# Patient Record
Sex: Female | Born: 2000 | Race: Black or African American | Hispanic: No | Marital: Single
Health system: Southern US, Community
[De-identification: ages and names within clinical notes are randomized; demographics above are authoritative.]

## PROBLEM LIST (undated history)

## (undated) DIAGNOSIS — F32A Depression, unspecified: Secondary | ICD-10-CM

## (undated) DIAGNOSIS — F419 Anxiety disorder, unspecified: Secondary | ICD-10-CM

## (undated) DIAGNOSIS — F329 Major depressive disorder, single episode, unspecified: Secondary | ICD-10-CM

## (undated) HISTORY — PX: NO PAST SURGERIES: SHX2092

---
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
## 2021-10-25 IMAGING — US US MFM OB COMP +14 WKS
1 series · 13 of 28 positions shown · non-contrast
Comparison: none

[Series 1: us mfm ob comp +14 wks · 13 of 127 slices shown]
[im 5/127]
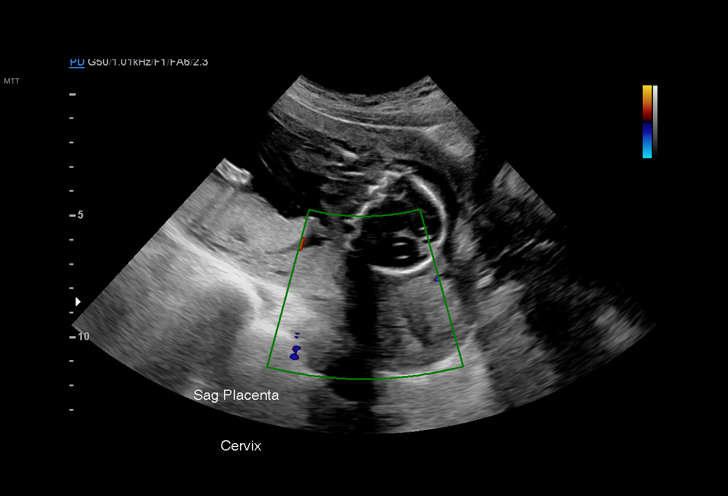
[im 15/127]
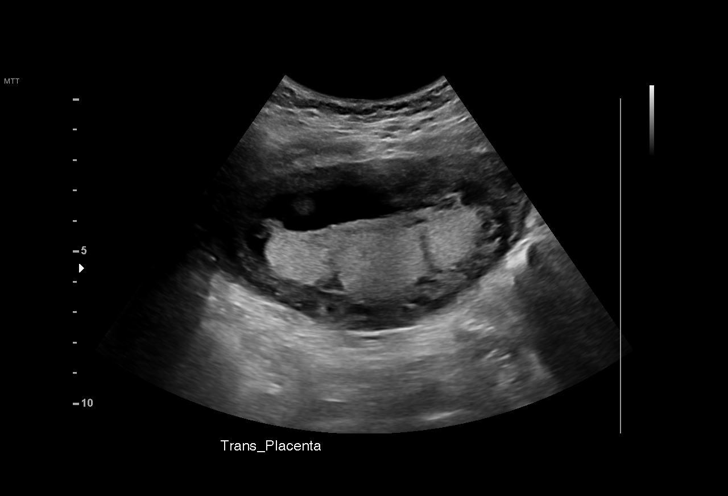
[im 24/127]
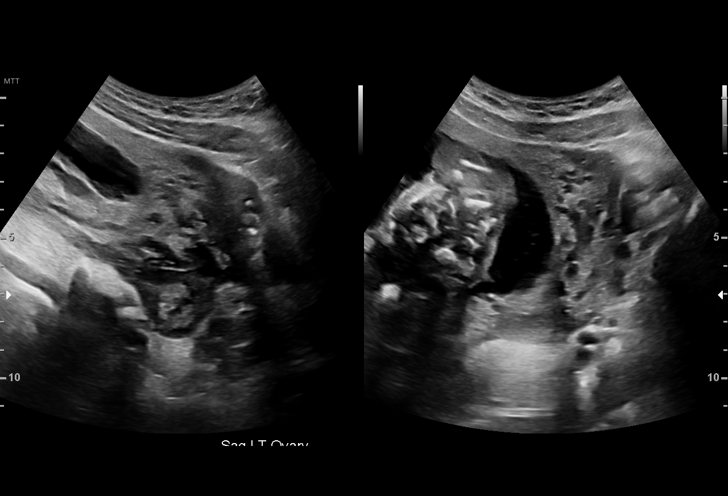
[im 33/127]
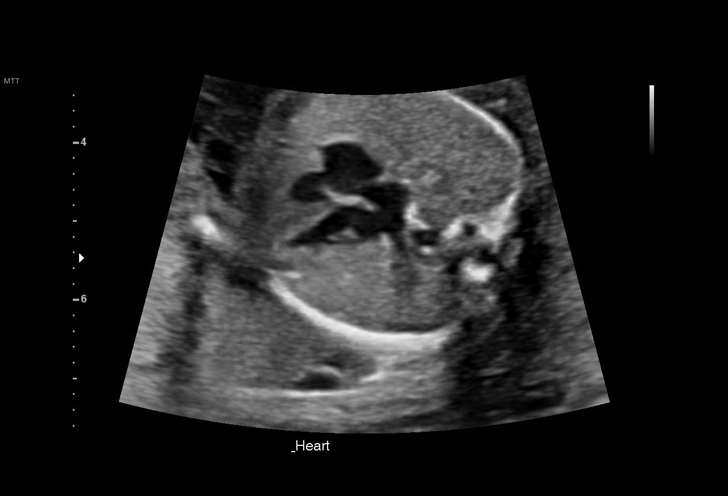
[im 43/127]
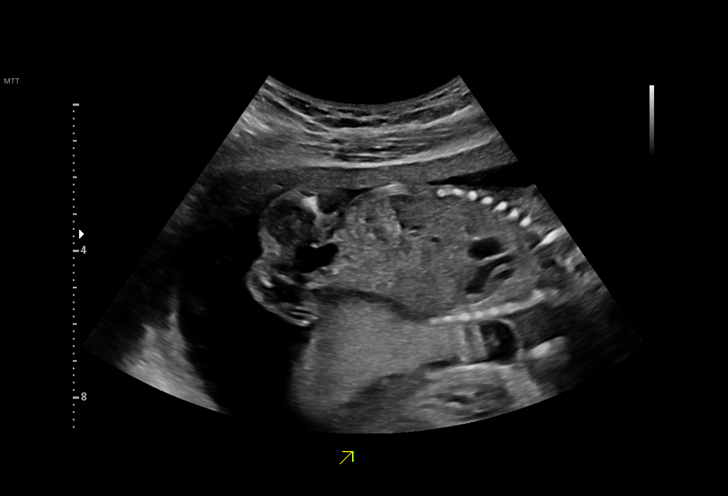
[im 52/127]
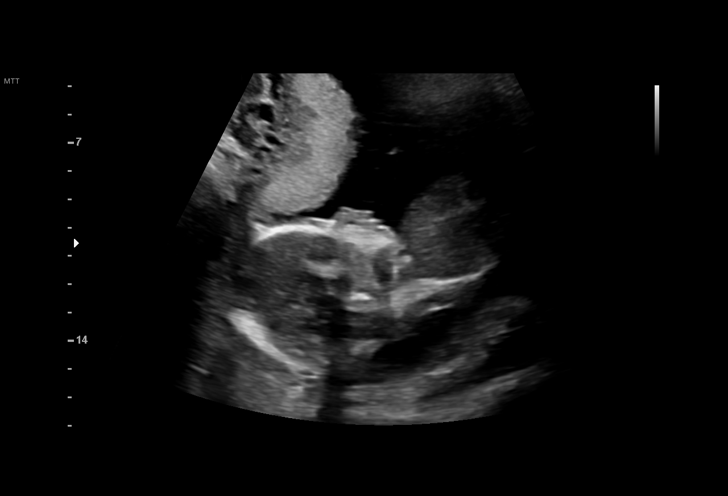
[im 66/127]
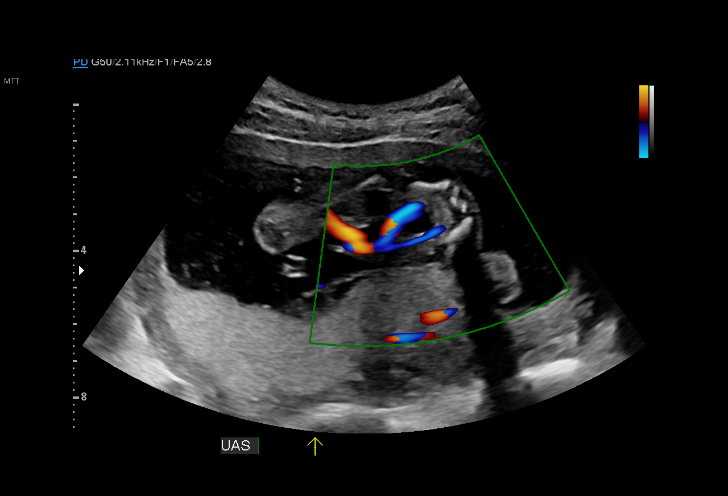
[im 75/127]
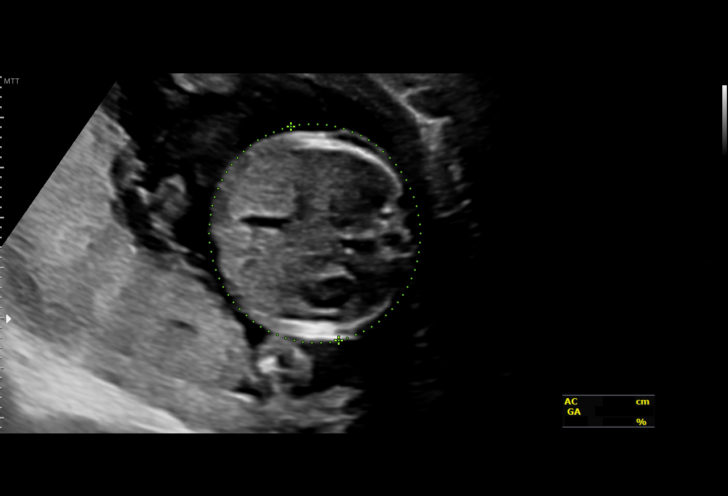
[im 85/127]
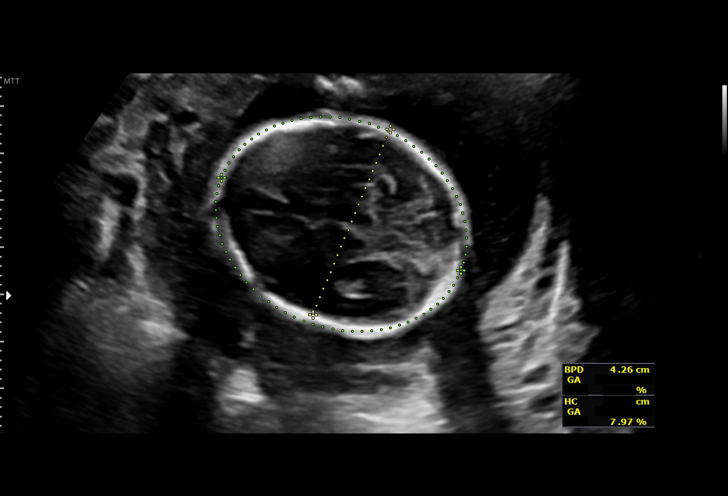
[im 94/127]
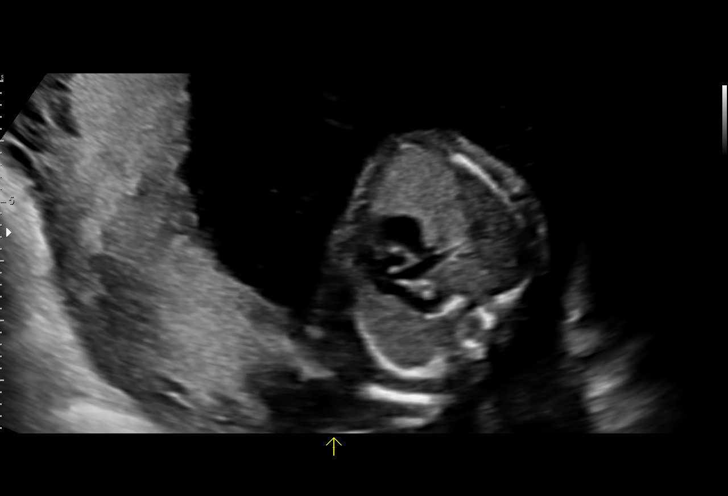
[im 103/127]
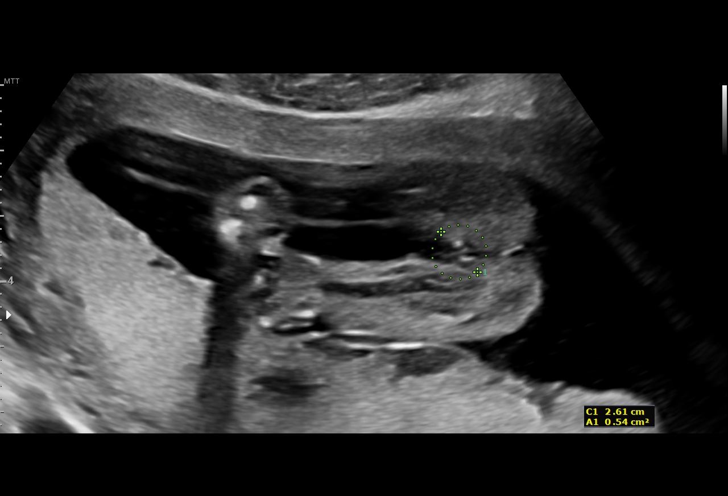
[im 113/127]
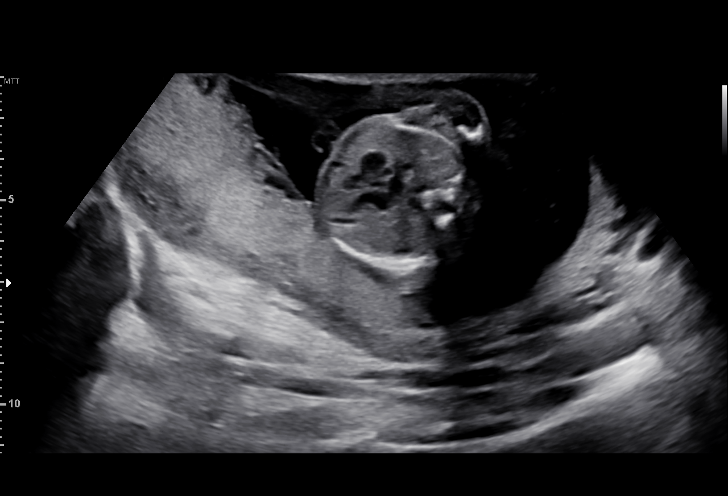
[im 122/127]
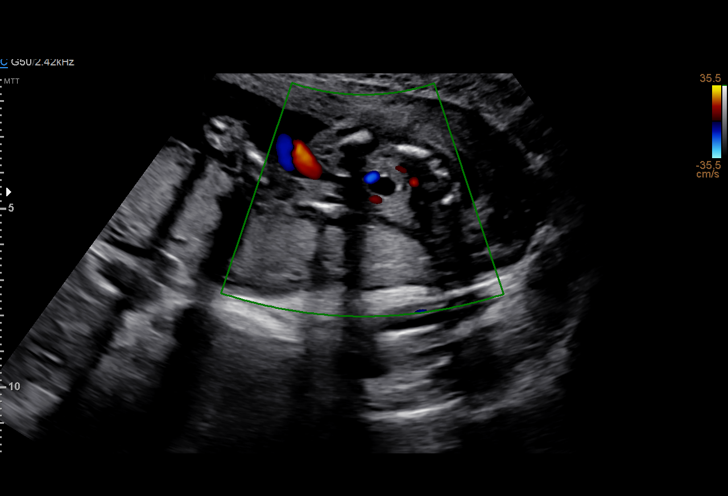

[13 of 28 positions shown; findings below may reference images not displayed]

1  US MFM OB COMP + 14 WK                76805.01    ILANA DUFF

Indications

 Encounter for antenatal screening for
 malformations
 19 weeks gestation of pregnancy
 NIPS:Low risk, Fem, FF:12%,   Genetic
 screening: Negative
Fetal Evaluation

 Num Of Fetuses:         1
 Fetal Heart Rate(bpm):  153
 Cardiac Activity:       Observed
 Presentation:           Cephalic
 Placenta:               Posterior
 P. Cord Insertion:      Visualized, central

 Amniotic Fluid
 AFI FV:      Within normal limits

                             Largest Pocket(cm)
                             4
Biometry
 BPD:      42.6  mm     G. Age:  18w 6d         33  %    CI:        75.56   %    70 - 86
                                                         FL/HC:      18.6   %    16.1 -
 HC:      155.4  mm     G. Age:  18w 3d         11  %    HC/AC:      1.14        1.09 -
 AC:      136.2  mm     G. Age:  19w 0d         38  %    FL/BPD:     67.8   %
 FL:       28.9  mm     G. Age:  18w 6d         28  %    FL/AC:      21.2   %    20 - 24
 HUM:      28.6  mm     G. Age:  19w 2d         50  %
 CER:      18.9  mm     G. Age:  18w 4d         17  %
 NFT:       4.2  mm

 LV:        5.9  mm
 CM:        4.4  mm

 Est. FW:     265  gm      0 lb 9 oz     26  %
OB History

 Gravidity:    1
Gestational Age

 LMP:           19w 2d        Date:  12/12/19                 EDD:   09/17/20
 U/S Today:     18w 6d                                        EDD:   09/20/20
 Best:          19w 2d     Det. By:  LMP  (12/12/19)          EDD:   09/17/20
Anatomy

 Cranium:               Appears normal         Aortic Arch:            Appears normal
 Cavum:                 Appears normal         Ductal Arch:            Not well visualized
 Ventricles:            Appears normal         Diaphragm:              Appears normal
 Choroid Plexus:        Appears normal         Stomach:                Appears normal, left
                                                                       sided
 Cerebellum:            Appears normal         Abdomen:                Appears normal
 Posterior Fossa:       Appears normal         Abdominal Wall:         Appears nml (cord
                                                                       insert, abd wall)
 Nuchal Fold:           Appears normal         Cord Vessels:           Appears normal (3
                                                                       vessel cord)
 Face:                  Appears normal         Kidneys:                Appear normal
                        (orbits and profile)
 Lips:                  Appears normal         Bladder:                Appears normal
 Thoracic:              Appears normal         Spine:                  Appears normal
 Heart:                 Not well visualized    Upper Extremities:      Appears normal
 RVOT:                  Not well visualized    Lower Extremities:      Appears normal
 LVOT:                  Not well visualized

 Other:  Fetus appears to be female. Nasal bone visualized. Heels and 5th
         digit visualized. Technically difficult due to fetal position.
Cervix Uterus Adnexa

 Cervix
 Length:            3.2  cm.
 Closed

 Uterus
 No abnormality visualized.
 Right Ovary
 Within normal limits. No adnexal mass visualized.

 Left Ovary
 Within normal limits. No adnexal mass visualized.

 Cul De Sac
 No free fluid seen.

 Adnexa
 No abnormality visualized.
Comments

 This patient was seen for a detailed fetal anatomy scan.
 She denies any significant past medical history and denies
 any problems in her current pregnancy.
 She had a cell free DNA test earlier in her pregnancy which
 indicated a low risk for trisomy 21, 18, and 13. A female fetus
 is predicted.
 She was informed that the fetal growth and amniotic fluid
 level were appropriate for her gestational age.
 There were no obvious fetal anomalies noted on today's
 ultrasound exam.  However, the views of the fetal anatomy
 were limited today due to the fetal position.
 The patient was informed that anomalies may be missed due
 to technical limitations. If the fetus is in a suboptimal position
 or maternal habitus is increased, visualization of the fetus in
 the maternal uterus may be impaired.
 A follow-up exam was scheduled in 4 weeks to complete the
 views of the fetal anatomy.

## 2021-12-04 ENCOUNTER — Ambulatory Visit (INDEPENDENT_AMBULATORY_CARE_PROVIDER_SITE_OTHER): Payer: Medicaid Other | Admitting: *Deleted

## 2021-12-04 ENCOUNTER — Other Ambulatory Visit: Payer: Self-pay

## 2021-12-04 DIAGNOSIS — N912 Amenorrhea, unspecified: Secondary | ICD-10-CM

## 2021-12-04 MED ORDER — POLYETHYLENE GLYCOL 3350 17 G PO PACK: 17.0000 g | PACK | Freq: Every day | ORAL | 0 refills | Status: DC

## 2021-12-04 NOTE — Progress Notes (Signed)
Ms. Kiernan presents today for UPT. She complains of constipation. ? ?LMP:10/20/21 ?EDD:07/27/22 ?   ?OBJECTIVE: Appears well, in no apparent distress.  ?OB History   ? ? Gravida  ?1  ? Para  ?1  ? Term  ?1  ? Preterm  ?   ? AB  ?   ? Living  ?1  ?  ? ? SAB  ?   ? IAB  ?   ? Ectopic  ?   ? Multiple  ?0  ? Live Births  ?1  ?   ?  ?  ? ?Home UPT Result:positive ?In-Office UPT result:positive ? ?I have reviewed the patient's allergies and medications.  ? ?ASSESSMENT: Positive pregnancy test ? ?PLAN ?Prenatal care to be completed at: [facility] ?Miralax ordered ? ? ?

## 2021-12-18 ENCOUNTER — Ambulatory Visit (INDEPENDENT_AMBULATORY_CARE_PROVIDER_SITE_OTHER): Payer: Medicaid Other | Admitting: Obstetrics and Gynecology

## 2021-12-18 ENCOUNTER — Ambulatory Visit (INDEPENDENT_AMBULATORY_CARE_PROVIDER_SITE_OTHER): Payer: Medicaid Other

## 2021-12-18 ENCOUNTER — Other Ambulatory Visit: Payer: Self-pay

## 2021-12-18 VITALS — BP 121/73 | HR 91 | Ht 64.0 in | Wt 149.9 lb

## 2021-12-18 DIAGNOSIS — N63 Unspecified lump in unspecified breast: Secondary | ICD-10-CM | POA: Diagnosis not present

## 2021-12-18 DIAGNOSIS — Z3481 Encounter for supervision of other normal pregnancy, first trimester: Secondary | ICD-10-CM

## 2021-12-18 DIAGNOSIS — O3680X0 Pregnancy with inconclusive fetal viability, not applicable or unspecified: Secondary | ICD-10-CM

## 2021-12-18 DIAGNOSIS — N6311 Unspecified lump in the right breast, upper outer quadrant: Secondary | ICD-10-CM | POA: Diagnosis not present

## 2021-12-18 DIAGNOSIS — Z3490 Encounter for supervision of normal pregnancy, unspecified, unspecified trimester: Secondary | ICD-10-CM | POA: Insufficient documentation

## 2021-12-18 HISTORY — DX: Unspecified lump in unspecified breast: N63.0

## 2021-12-18 MED ORDER — BLOOD PRESSURE KIT DEVI: 1.0000 | 0 refills | Status: DC

## 2021-12-18 MED ORDER — GOJJI WEIGHT SCALE MISC: 1.0000 | 0 refills | Status: AC

## 2021-12-18 NOTE — Progress Notes (Signed)
?  CC; breast mass/tenderness ?Subjective:  ? ? Patient ID: Cynthia Villarreal, female    DOB: Feb 21, 2001, 21 y.o.   MRN: [id number] ? ?HPI ?21 yo G2P1 seen for right breast tenderness.  PT has noted the breast tenderness for the last 1-2 weeks.  Pt states she can feel a mobile nodule only in the right breast.  She denies any breast leakage.  Pt also recently found out she was pregnant. ? ? ?Review of Systems ? ?   ?Objective:  ? Physical Exam ?There were no vitals filed for this visit. ? ?Breast exam:  palpable pea sized mass in the right breast at 10 oclock ? ?   ?Assessment & Plan:  ? ?1. Breast mass in female ?Likely benign mass tender and more noticeable due to pregnancy hormones ?Will get breast ultrasound for further eval, pt hasnew OB visit in 2-3 weeks ? ?- US BREAST LTD UNI RIGHT INC AXILLA; Future ? ? ? ?Cynthia Basil, MD ?Faculty Attending, [facility]  ?

## 2021-12-18 NOTE — Progress Notes (Signed)
Pt complaining of tenderness and lump in R breast ?

## 2021-12-18 NOTE — Progress Notes (Signed)
Patient was assessed and managed by nursing staff during this encounter. I have reviewed the chart and agree with the documentation and plan. I have also made any necessary editorial changes. ? ?Jalonda Antigua A Jacquez Sheetz, MD ?12/18/2021 1:20 PM   ?

## 2021-12-18 NOTE — Progress Notes (Cosign Needed)
New OB Intake ? ?I connected with  Cynthia Villarreal on 12/18/21 at 10:15 AM EDT by in person Video Visit and verified that I am speaking with the correct person using two identifiers. Nurse is located at [facility] and pt is located at [place]. ? ?I discussed the limitations, risks, security and privacy concerns of performing an evaluation and management service by telephone and the availability of in person appointments. I also discussed with the patient that there may be a patient responsible charge related to this service. The patient expressed understanding and agreed to proceed. ? ?I explained I am completing New OB Intake today. We discussed her EDD of 07/27/22 that is based on LMP of 10/20/21. Pt is G2/P1001. I reviewed her allergies, medications, Medical/Surgical/OB history, and appropriate screenings. I informed her of [facility] services. Based on history, this is a/an  pregnancy uncomplicated .  ? ?Patient Active Problem List  ? Diagnosis Date Noted  ? Encounter for induction of labor 09/01/2020  ? Depression   ? GBS (group B Streptococcus carrier), +RV culture, currently pregnant 08/27/2020  ? IUGR (intrauterine growth restriction) affecting care of mother 07/03/2020  ? Poor weight gain of pregnancy 05/07/2020  ? Chlamydia trachomatis infection in mother during pregnancy 04/04/2020  ? Supervision of high risk pregnancy, antepartum 02/29/2020  ? ? ?Concerns addressed today ? ?Delivery Plans:  ?Plans to deliver at [facility].  ? ?MyChart/Babyscripts ?MyChart access verified. I explained pt will have some visits in office and some virtually. Babyscripts instructions given and order placed. Patient verifies receipt of registration text/e-mail. Account successfully created and app downloaded. ? ?Blood Pressure Cuff  ?Blood pressure cuff ordered for patient to pick-up from [employer]. Explained after first prenatal appt pt will check weekly and document in 68. ? ?Weight scale: Patient does not  have weight  scale. Weight scale ordered for patient to pick up from [employer].  ? ?Anatomy US ?Explained first scheduled [place] will be around 19 weeks. Dating and viability scan performed today. ? ?Labs ?Discussed Johnsie Cancel genetic screening with patient. Would like both Panorama and Horizon drawn at new OB visit.Also if interested in genetic testing, tell patient she will need AFP 15-21 weeks to complete genetic testing .Routine prenatal labs needed. ? ?Covid Vaccine ?Patient has not covid vaccine.  ? ?  ?Is patient interested in [place]? No  "Interested in [employer] - Schedule next visit with CNM" on sticky note ? ?Informed patient of [facility] website  and placed link in her AVS.  ? ?Social Determinants of Health ?Food Insecurity: Patient denies food insecurity. ?WIC Referral: Patient is interested in referral to [facility].  ?Transportation: Patient denies transportation needs. ?Childcare: Discussed no children allowed at ultrasound appointments. Offered childcare services; patient declines childcare services at this time. ? ?Send link to Pregnancy Navigators ? ? ?Placed OB Box on problem list and updated ? ?First visit review ?I reviewed new OB appt with pt. I explained she will have a pelvic exam, ob bloodwork with genetic screening, and PAP smear. Explained pt will be seen by Arlina Robes at first visit; encounter routed to appropriate provider. Explained that patient will be seen by pregnancy navigator following visit with provider. [facility] information placed in AVS.  ? ?Lucianne Lei, RN ?12/18/2021  10:49 AM  ?

## 2021-12-23 ENCOUNTER — Inpatient Hospital Stay (HOSPITAL_COMMUNITY)
Admission: EM | Admit: 2021-12-23 | Discharge: 2021-12-25 | DRG: 832 | Disposition: A | Discharge status: Home / Self Care | Payer: Medicaid Other | Attending: Internal Medicine | Admitting: Internal Medicine

## 2021-12-23 ENCOUNTER — Emergency Department (HOSPITAL_COMMUNITY): Payer: Medicaid Other

## 2021-12-23 ENCOUNTER — Encounter (HOSPITAL_COMMUNITY): Payer: Self-pay

## 2021-12-23 ENCOUNTER — Other Ambulatory Visit: Payer: Self-pay

## 2021-12-23 DIAGNOSIS — E876 Hypokalemia: Secondary | ICD-10-CM | POA: Diagnosis present

## 2021-12-23 DIAGNOSIS — R569 Unspecified convulsions: Secondary | ICD-10-CM

## 2021-12-23 DIAGNOSIS — G4089 Other seizures: Secondary | ICD-10-CM | POA: Diagnosis not present

## 2021-12-23 DIAGNOSIS — O99341 Other mental disorders complicating pregnancy, first trimester: Secondary | ICD-10-CM | POA: Diagnosis present

## 2021-12-23 DIAGNOSIS — N12 Tubulo-interstitial nephritis, not specified as acute or chronic: Principal | ICD-10-CM

## 2021-12-23 DIAGNOSIS — Z20822 Contact with and (suspected) exposure to covid-19: Secondary | ICD-10-CM | POA: Diagnosis present

## 2021-12-23 DIAGNOSIS — R32 Unspecified urinary incontinence: Secondary | ICD-10-CM | POA: Diagnosis present

## 2021-12-23 DIAGNOSIS — F410 Panic disorder [episodic paroxysmal anxiety] without agoraphobia: Secondary | ICD-10-CM | POA: Diagnosis present

## 2021-12-23 DIAGNOSIS — O2301 Infections of kidney in pregnancy, first trimester: Secondary | ICD-10-CM | POA: Diagnosis present

## 2021-12-23 DIAGNOSIS — K029 Dental caries, unspecified: Secondary | ICD-10-CM | POA: Diagnosis present

## 2021-12-23 DIAGNOSIS — E871 Hypo-osmolality and hyponatremia: Secondary | ICD-10-CM | POA: Diagnosis present

## 2021-12-23 DIAGNOSIS — O99351 Diseases of the nervous system complicating pregnancy, first trimester: Principal | ICD-10-CM | POA: Diagnosis present

## 2021-12-23 DIAGNOSIS — F419 Anxiety disorder, unspecified: Secondary | ICD-10-CM | POA: Diagnosis present

## 2021-12-23 DIAGNOSIS — Z79899 Other long term (current) drug therapy: Secondary | ICD-10-CM

## 2021-12-23 DIAGNOSIS — Z8249 Family history of ischemic heart disease and other diseases of the circulatory system: Secondary | ICD-10-CM

## 2021-12-23 DIAGNOSIS — N390 Urinary tract infection, site not specified: Secondary | ICD-10-CM | POA: Diagnosis present

## 2021-12-23 DIAGNOSIS — O99281 Endocrine, nutritional and metabolic diseases complicating pregnancy, first trimester: Secondary | ICD-10-CM | POA: Diagnosis present

## 2021-12-23 DIAGNOSIS — Z833 Family history of diabetes mellitus: Secondary | ICD-10-CM

## 2021-12-23 DIAGNOSIS — Z3A11 11 weeks gestation of pregnancy: Secondary | ICD-10-CM

## 2021-12-23 DIAGNOSIS — Z3490 Encounter for supervision of normal pregnancy, unspecified, unspecified trimester: Secondary | ICD-10-CM

## 2021-12-23 LAB — COMPREHENSIVE METABOLIC PANEL
ALT: 8 U/L (ref 0–44)
AST: 16 U/L (ref 15–41)
Albumin: 4 g/dL (ref 3.5–5.0)
Alkaline Phosphatase: 71 U/L (ref 38–126)
Anion gap: 10 (ref 5–15)
BUN: <5 mg/dL — ABNORMAL LOW (ref 6–20)
CO2: 23 mmol/L (ref 22–32)
Calcium: 9.6 mg/dL (ref 8.9–10.3)
Chloride: 101 mmol/L (ref 98–111)
Creatinine, Ser: 0.67 mg/dL (ref 0.44–1.00)
GFR, Estimated: >60 mL/min (ref >60)
Glucose, Bld: 87 mg/dL (ref 70–99)
Potassium: 3.1 mmol/L — ABNORMAL LOW (ref 3.5–5.1)
Sodium: 134 mmol/L — ABNORMAL LOW (ref 135–145)
Total Bilirubin: 0.9 mg/dL (ref 0.3–1.2)
Total Protein: 7.9 g/dL (ref 6.5–8.1)

## 2021-12-23 LAB — CBC WITH DIFFERENTIAL/PLATELET
Abs Immature Granulocytes: 0.03 10*3/uL (ref 0.00–0.07)
Basophils Absolute: 0 10*3/uL (ref 0.0–0.1)
Basophils Relative: 0 %
Eosinophils Absolute: 0 10*3/uL (ref 0.0–0.5)
Eosinophils Relative: 0 %
HCT: 39.3 % (ref 36.0–46.0)
Hemoglobin: 12.7 g/dL (ref 12.0–15.0)
Immature Granulocytes: 0 %
Lymphocytes Relative: 9 %
Lymphs Abs: 0.8 10*3/uL (ref 0.7–4.0)
MCH: 28.1 pg (ref 26.0–34.0)
MCHC: 32.3 g/dL (ref 30.0–36.0)
MCV: 86.9 fL (ref 80.0–100.0)
Monocytes Absolute: 0.5 10*3/uL (ref 0.1–1.0)
Monocytes Relative: 5 %
Neutro Abs: 8.2 10*3/uL — ABNORMAL HIGH (ref 1.7–7.7)
Neutrophils Relative %: 86 %
Platelets: 225 10*3/uL (ref 150–400)
RBC: 4.52 MIL/uL (ref 3.87–5.11)
RDW: 13.7 % (ref 11.5–15.5)
WBC: 9.5 10*3/uL (ref 4.0–10.5)
nRBC: 0 % (ref 0.0–0.2)

## 2021-12-23 LAB — URINALYSIS, ROUTINE W REFLEX MICROSCOPIC
Bilirubin Urine: NEGATIVE
Glucose, UA: NEGATIVE mg/dL
Ketones, ur: 5 mg/dL — AB
Nitrite: POSITIVE — AB
Protein, ur: NEGATIVE mg/dL
Specific Gravity, Urine: 1.009 (ref 1.005–1.030)
WBC, UA: >50 WBC/hpf — ABNORMAL HIGH (ref 0–5)
pH: 7 (ref 5.0–8.0)

## 2021-12-23 LAB — CBG MONITORING, ED: Glucose-Capillary: 81 mg/dL (ref 70–99)

## 2021-12-23 LAB — RAPID URINE DRUG SCREEN, HOSP PERFORMED
Amphetamines: NOT DETECTED
Barbiturates: NOT DETECTED
Benzodiazepines: NOT DETECTED
Cocaine: NOT DETECTED
Opiates: NOT DETECTED
Tetrahydrocannabinol: POSITIVE — AB

## 2021-12-23 LAB — WET PREP, GENITAL
Clue Cells Wet Prep HPF POC: NONE SEEN
Sperm: NONE SEEN
Trich, Wet Prep: NONE SEEN
WBC, Wet Prep HPF POC: >=10 — AB (ref <10)
Yeast Wet Prep HPF POC: NONE SEEN

## 2021-12-23 LAB — HCG, QUANTITATIVE, PREGNANCY: hCG, Beta Chain, Quant, S: 188068 m[IU]/mL — ABNORMAL HIGH (ref <5)

## 2021-12-23 LAB — TROPONIN I (HIGH SENSITIVITY): Troponin I (High Sensitivity): 13 ng/L (ref <18)

## 2021-12-23 LAB — MAGNESIUM: Magnesium: 1.8 mg/dL (ref 1.7–2.4)

## 2021-12-23 LAB — POC URINE PREG, ED: Preg Test, Ur: POSITIVE — AB

## 2021-12-23 MED ORDER — ONDANSETRON HCL 4 MG/2ML IJ SOLN
4.0000 mg | Freq: Once | INTRAMUSCULAR | Status: AC
  Administered 2021-12-23: 4 mg via INTRAVENOUS

## 2021-12-23 MED ORDER — SODIUM CHLORIDE 0.9 % IV SOLN
1.0000 g | Freq: Once | INTRAVENOUS | Status: AC
  Administered 2021-12-23: 1 g via INTRAVENOUS
  Filled 2021-12-23: qty 10

## 2021-12-23 MED ORDER — LIDOCAINE-EPINEPHRINE (PF) 2 %-1:200000 IJ SOLN
10.0000 mL | Freq: Once | INTRAMUSCULAR | Status: DC
Start: 2021-12-23 — End: 2021-12-23

## 2021-12-23 MED ORDER — LACTATED RINGERS IV BOLUS
1000.0000 mL | Freq: Once | INTRAVENOUS | Status: AC
  Administered 2021-12-23: 1000 mL via INTRAVENOUS

## 2021-12-23 MED ORDER — POTASSIUM CHLORIDE CRYS ER 20 MEQ PO TBCR
40.0000 meq | EXTENDED_RELEASE_TABLET | Freq: Once | ORAL | Status: AC
Start: 2021-12-23 — End: 2021-12-23
  Administered 2021-12-23: 40 meq via ORAL
  Filled 2021-12-23: qty 2

## 2021-12-23 MED ORDER — ACETAMINOPHEN 325 MG PO TABS
650.0000 mg | ORAL_TABLET | Freq: Once | ORAL | Status: AC
  Administered 2021-12-23: 650 mg via ORAL
  Filled 2021-12-23: qty 2

## 2021-12-23 MED ORDER — AMOXICILLIN-POT CLAVULANATE 875-125 MG PO TABS
1.0000 | ORAL_TABLET | Freq: Once | ORAL | Status: AC
  Administered 2021-12-23: 1 via ORAL
  Filled 2021-12-23: qty 1

## 2021-12-23 MED ORDER — ONDANSETRON HCL 4 MG/2ML IJ SOLN
INTRAMUSCULAR | Status: AC
  Filled 2021-12-23: qty 2

## 2021-12-23 NOTE — ED Triage Notes (Signed)
Pt also c/o cloudy and malodorous urine.  ?

## 2021-12-23 NOTE — H&P (Signed)
?History and Physical  ? ? ?Cynthia Villarreal MEQ:[id number] DOB: 01/19/2001 DOA: 12/23/2021 ? ?PCP: Department, [facility]  ?Patient coming from: Home ? ?I have personally briefly reviewed patient's old medical records in [place] ? ?Chief Complaint: Seizure-like activity ? ?HPI: ?Cynthia Villarreal is a G2P1 21 y.o. female with medical history significant for depression/anxiety who is [redacted] weeks pregnant presented to the ED for evaluation of seizure-like activity.  History is limited from patient as she does not recall the events and is otherwise supplemented by EDP and chart review. ? ?Patient states that she was seen by her dentist earlier 3/20.  She was told she had an infection of her left lower posterior tooth and was prescribed Augmentin.  She remembers getting to the car but does not remember what happened next.  Her boyfriend was with her and witnessed her having a staring spell with several seconds of unresponsiveness.  She then became alert and decided go to sleep.  She slept for a few minutes and appeared normal initially after waking then began to exhibit seizure-like activity.  Legs were extended, arms bent at the elbows bilaterally with upper extremity shaking.  Eyes were rolled back in her head.  This lasted about a minute and afterwards she was somnolent and confused.  She was incontinent of urine.  No tongue biting or foaming at the mouth reported.  EMS were called and she was brought to the ED for further evaluation. ? ?Patient also reports right upper quadrant and right flank discomfort.  She has had increased urinary frequency but denies any significant burning or pain with urination.  She denies any prior history of seizures. ? ?ED Course  Labs/Imaging on admission: I have personally reviewed following labs and imaging studies. ? ?Initial vitals showed BP 125/69, pulse 95, RR 20, temp 100.3 ?F, SPO2 100% on room air. ? ?Labs show WBC 9.5, hemoglobin 12.7, platelets 225,000, sodium  134, potassium 3.1, bicarb 23, BUN <5, creatinine 0.67, serum glucose 87, LFTs within normal limits, magnesium 1.8, troponin 13. ? ?UDS positive for THC.  Urinalysis shows positive nitrates, large leukocytes, 0-5 RBC/hpf, >50 WBC, many bacteria on microscopy.  Urine culture in process.  Urine pregnancy is positive.  Beta hCG [id number].  Wet prep negative for yeast, trichomonas, clue cells.  GC chlamydia pending ? ?MRI brain without contrast is normal study. ? ?RUQ abdominal ultrasound is a normal study. ? ?Patient was given 1 L LR, 40 mill equivalents oral potassium, IV ceftriaxone, oral Augmentin.  Neurology were consulted and recommended admission with long-term EEG to evaluate seizure-like activity further. EDP discussed with on call Ob/Gyn who had no further recommendations.  The hospitalist service was consulted to admit for further evaluation and management. ? ?Review of Systems: All systems reviewed and are negative except as documented in history of present illness above. ? ? ?Past Medical History:  ?Diagnosis Date  ? Anxiety   ? Depression   ? ? ?Past Surgical History:  ?Procedure Laterality Date  ? NO PAST SURGERIES    ? ? ?Social History: ? reports that she is a non-smoker but has been exposed to tobacco smoke. She has never used smokeless tobacco. She reports that she does not drink alcohol and does not use drugs. ? ?Allergies  ?Allergen Reactions  ? Caffeine Anxiety  ? ? ?Family History  ?Problem Relation Age of Onset  ? Hypertension Mother   ? Diabetes Mother   ? Heart disease Maternal Grandmother   ? Hypertension Other   ? ? ? ?  Prior to Admission medications   ?Medication Sig Start Date End Date Taking? Authorizing Provider  ?acetaminophen (TYLENOL) 325 MG tablet Take 2 tablets (650 mg total) by mouth every 4 (four) hours as needed (for pain scale < 4). ?Patient taking differently: Take 325 mg by mouth every 4 (four) hours as needed for moderate pain or headache (for pain scale < 4). 09/03/20  Yes  Layla Barter, MD  ?Prenatal MV & Min w/FA-DHA (PRENATAL GUMMIES PO) Take 1 tablet by mouth in the morning and at bedtime.   Yes [provider]  ?Blood Pressure Monitoring (BLOOD PRESSURE KIT) DEVI 1 kit by Does not apply route once a week. 12/18/21   Griffin Basil, MD  ?Misc. Devices (GOJJI WEIGHT SCALE) MISC 1 Device by Does not apply route every 30 (thirty) days. 12/18/21   Griffin Basil, MD  ? ? ?Physical Exam: ?Vitals:  ? 12/23/21 1900 12/23/21 2123 12/23/21 2130 12/23/21 2200  ?BP: (!) 112/59 113/60 106/61 106/64  ?Pulse: 82 93 83 97  ?Resp: 19 20 19 20   ?Temp:      ?TempSrc:      ?SpO2: 100% 100% 96% 100%  ?Weight:      ?Height:      ? ?Constitutional: Resting in bed.EEG tech at bedside preparing for LTM EEG.  NAD, calm, comfortable ?Eyes: PERRL, lids and conjunctivae normal ?ENMT: Mucous membranes are dry. Posterior pharynx clear of any exudate or lesions.left lower posterior tooth carie.  ?Neck: normal, supple, no masses. ?Respiratory: clear to auscultation bilaterally, no wheezing, no crackles. Normal respiratory effort. No accessory muscle use.  ?Cardiovascular: Regular rate and rhythm, no murmurs / rubs / gallops. No extremity edema. 2+ pedal pulses. ?Abdomen: no tenderness. No hepatosplenomegaly.  ?Musculoskeletal: no clubbing / cyanosis. No joint deformity upper and lower extremities. Good ROM, no contractures. Normal muscle tone.  ?Skin: no rashes, lesions, ulcers. No induration ?Neurologic: CN 2-12 grossly intact. Sensation intact. Strength 5/5 in all 4.  ?Psychiatric: Alert and oriented x 3. Normal mood.  ? ?EKG: Personally reviewed. Normal sinus rhythm without acute ischemic changes.  Similar to prior. ? ?Assessment/Plan ?Principal Problem: ?  Seizure-like activity ([place]) ?Active Problems: ?  UTI (urinary tract infection) ?  Pregnancy ?  Hypokalemia ?  ?Cynthia Villarreal is a G48P1 21 y.o. female with medical history significant for depression/anxiety who is [redacted] weeks pregnant is  admitted for evaluation of seizure-like activity.  Started on LTM EEG per neurology. ? ?Assessment and Plan: ?* Seizure-like activity ([place]) ?Neurology following.  Admission recommended for further evaluation, differential including first-time epileptic seizure versus pseudoseizure. ?-LTM EEG per neurology ?-Continue seizure precautions ?-Further management pending EEG result ? ?UTI (urinary tract infection) ?Urinalysis suggestive of UTI with clinical suspicion of right-sided pyelonephritis. ?-Continue IV ceftriaxone ?-Follow urine culture ? ?Hypokalemia ?Supplement orally. ? ?Pregnancy ?Patient is approximately [redacted] weeks pregnant.  Will consult pharmacy to review medication safety. ? ?DVT prophylaxis: enoxaparin (LOVENOX) injection 40 mg Start: 12/24/21 2000 ?Code Status: Full code ?Family Communication: Discussed with patient, she has discussed with family ?Disposition Plan: From home and likely discharge to home pending clinical progress ?Consults called: Neurology ?Severity of Illness: ?The appropriate patient status for this patient is OBSERVATION. Observation status is judged to be reasonable and necessary in order to provide the required intensity of service to ensure the patient's safety. The patient's presenting symptoms, physical exam findings, and initial radiographic and laboratory data in the context of their medical condition is felt to place them at decreased  risk for further clinical deterioration. Furthermore, it is anticipated that the patient will be medically stable for discharge from the hospital within 2 midnights of admission.   ?Zada Finders MD ?[facility] ? ?If 7PM-7AM, please contact night-coverage ?[email or web address] ? ?12/24/2021, 12:51 AM  ?

## 2021-12-23 NOTE — Consult Note (Signed)
?                    NEURO HOSPITALIST CONSULT NOTE  ? ?Requestig physician: Dr. Francia Greaves ? ?Reason for Consult: New onset seizure-like activity in a G2P1 female who is 10-[redacted] weeks pregnant. ? ?History obtained from:  Patient, Boyfriend and Chart    ? ?HPI:                                                                                                                                         ? Cynthia Villarreal is an 21 y.o. female, G2P1, who is [redacted] weeks pregnant, with history of anxiety and depression, who presents to the ED after seizure-like activity occurred today while she was in a car with family after going to a dentist appointment. The patient has no memory of the event. Her boyfriend states that she acting normally when walking back to the car, but once she was inside, she began staring straight ahead and was unresponsive for several seconds. She then became alert and decided to go to sleep. She slept for a few minutes and on awakening she appeared normal initially, but then started to exhibit seizure activity as follows: Legs extended, arms bent at elbows bilaterally with upper extremity shaking, eyes rolled back in head. The above seizure-like activity lasted for about 1 minute, following which she was somnolent and then confused. No tongue biting or foaming at the mouth, but urinary incontinence did occur. EMS was called and on their arrival she was still confused. Boyfriend states that when he arrived to her room in the ED, she was back to normal.  ? ?Although note by ED PA states that patient has a history of seizures, the patient reveals that these were panic attacks and that she has no prior history of convulsions. She does have a younger brother with seizures as well as "seizures on my grandfather's side of the family".  ? ? ? ?Past Medical History:  ?Diagnosis Date  ? Anxiety   ? Depression   ? ? ?Past Surgical History:  ?Procedure Laterality Date  ? NO PAST SURGERIES    ? ? ?Family History  ?Problem  Relation Age of Onset  ? Hypertension Mother   ? Diabetes Mother   ? Heart disease Maternal Grandmother   ? Hypertension Other   ?        ? ?Social History:  reports that she is a non-smoker but has been exposed to tobacco smoke. She has never used smokeless tobacco. She reports that she does not drink alcohol and does not use drugs. ? ?Allergies  ?Allergen Reactions  ? Caffeine Anxiety  ? ? ?HOME MEDICATIONS:                                                                                                                     ? ?  No current facility-administered medications on file prior to encounter.  ? ?Current Outpatient Medications on File Prior to Encounter  ?Medication Sig Dispense Refill  ? acetaminophen (TYLENOL) 325 MG tablet Take 2 tablets (650 mg total) by mouth every 4 (four) hours as needed (for pain scale < 4). (Patient taking differently: Take 325 mg by mouth every 4 (four) hours as needed for moderate pain or headache (for pain scale < 4).)    ? Prenatal MV & Min w/FA-DHA (PRENATAL GUMMIES PO) Take 1 tablet by mouth in the morning and at bedtime.    ? Blood Pressure Monitoring (BLOOD PRESSURE KIT) DEVI 1 kit by Does not apply route once a week. 1 each 0  ? Misc. Devices (GOJJI WEIGHT SCALE) MISC 1 Device by Does not apply route every 30 (thirty) days. 1 each 0  ? ? ? ?ROS:                                                                                                                                       ?As per HPI. Does not endorse any other symptoms, including no current confusion, limb weakness, vision changes or aphasia.  ? ? ?Blood pressure 113/60, pulse 93, temperature 100.3 ?F (37.9 ?C), temperature source Oral, resp. rate 20, height _0  (1.626 m), weight 63.5 kg, last menstrual period 10/20/2021, SpO2 100 %, not currently breastfeeding. ? ? ?General Examination:                                                                                                      ? ?Physical Exam  ?HEENT-   [place]/AT    ?Lungs- Respirations unlabored ?Extremities- No edema ? ? ?Neurological Examination ?Mental Status: Alert, oriented x 5, thought content appropriate but with a somewhat childlike affect.  Speech fluent without evidence of aphasia.  Able to follow all commands without difficulty. ?Cranial Nerves: ?II: Temporal visual fields intact with no extinction to DSS. PERRL ?III,IV, VI: No ptosis. EOMI. No nystagmus.  ?V: Temp sensation equal bilaterally ?VII: Smile symmetric ?VIII: hearing intact to voice ?IX,X: Palate rises symmetrically ?XI: Symmetric shoulder shrug ?XII: Midline tongue extension ?Motor: ?Right : Upper extremity   5/5    Left:     Upper extremity   5/5 ? Lower extremity   5/5     Lower extremity   5/5 ?No pronator drift ?No adventitious movements noted.  ?Sensory: Temp and light touch intact throughout, bilaterally. No extinction to DSS ?Deep Tendon Reflexes: 2-3+  and symmetric throughout ?Cerebellar: No ataxia with FNF bilaterally ?Gait: Deferred ?  ?Lab Results: ?Basic Metabolic Panel: ?Recent Labs  ?Lab 12/23/21 ?2620  ?NA 134*  ?K 3.1*  ?CL 101  ?CO2 23  ?GLUCOSE 87  ?BUN <5*  ?CREATININE 0.67  ?CALCIUM 9.6  ?MG 1.8  ? ? ?CBC: ?Recent Labs  ?Lab 12/23/21 ?3559  ?WBC 9.5  ?NEUTROABS 8.2*  ?HGB 12.7  ?HCT 39.3  ?MCV 86.9  ?PLT 225  ? ? ?Cardiac Enzymes: ?No results for input(s): CKTOTAL, CKMB, CKMBINDEX, TROPONINI in the last 168 hours. ? ?Lipid Panel: ?No results for input(s): CHOL, TRIG, HDL, CHOLHDL, VLDL, LDLCALC in the last 168 hours. ? ?Imaging: ?MR BRAIN WO CONTRAST ? ?Result Date: 12/23/2021 ?CLINICAL DATA:  Seizure EXAM: MRI HEAD WITHOUT CONTRAST TECHNIQUE: Multiplanar, multiecho pulse sequences of the brain and surrounding structures were obtained without intravenous contrast. COMPARISON:  None. FINDINGS: Brain: No acute infarct, mass effect or extra-axial collection. No acute or chronic hemorrhage. Normal white matter signal, parenchymal volume and CSF spaces. The midline structures are  normal. The hippocampi are normal and symmetric in size and signal. The hypothalamus and mamillary bodies are normal. There is no cortical ectopia or dysplasia. Vascular: Major flow voids are preserved. Skull and upper cervical spine: Normal calvarium and skull base. Visualized upper cervical spine and soft tissues are normal. Sinuses/Orbits:No paranasal sinus fluid levels or advanced mucosal thickening. No mastoid or middle ear effusion. Normal orbits. IMPRESSION: Normal brain MRI. Electronically Signed   By: Ulyses Jarred M.D.   On: 12/23/2021 21:47  ? ?US Abdomen Limited RUQ (LIVER/GB) ? ?Result Date: 12/23/2021 ?CLINICAL DATA:  Right upper quadrant abdominal pain EXAM: ULTRASOUND ABDOMEN LIMITED RIGHT UPPER QUADRANT COMPARISON:  None. FINDINGS: Gallbladder: No gallstones or wall thickening visualized. No sonographic Murphy sign noted by sonographer. Common bile duct: Diameter: 4 mm in proximal diameter Liver: No focal lesion identified. Within normal limits in parenchymal echogenicity. Portal vein is patent on color Doppler imaging with normal direction of blood flow towards the liver. Other: None. IMPRESSION: Normal right upper quadrant sonogram Electronically Signed   By: Fidela Salisbury M.D.   On: 12/23/2021 21:58   ? ?Assessment: New onset seizure-like activity in a G2P1 female who is 10-[redacted] weeks pregnant. ?1. Exam is nonfocal ?2. MRI brain is normal.  ?3. DDx primarily first time epileptic seizure versus pseudoseizure given her history of anxiety with panic attacks, as well as what patient states were significant feelings of stress at and after her Dentist appointment.  ? ?Recommendations: ?1. LTM EEG ?2. Inpatient seizure precautions ?3. Decision regarding whether or not to start an anticonvulsant will depend on EEG results.  ? ?Electronically signed: Dr. Kerney Elbe ?12/23/2021, 10:14 PM ? ? ?   ?

## 2021-12-23 NOTE — ED Notes (Signed)
Pt brought back from MRI. Placed back on cardiac monitor and medications given. Pt A/Ox4, NAD. ?

## 2021-12-23 NOTE — ED Triage Notes (Signed)
Pt BIB [facility] for sz like activity. EMS states family saw pt have a "full body seizure." Pt has hx of focal sz. EMS report pt was postictal on assessment. Pt has c/o HA, nausea, and L sided chest discomfort. Pt is 10-11 weeks gravid. G2P1.  ?

## 2021-12-23 NOTE — ED Provider Notes (Signed)
?[facility] ?Provider Note ? ? ?CSN: [id number] ?Arrival date & time: 12/23/21  1653 ? ?  ? ?History ? ?Chief Complaint  ?Patient presents with  ? Seizures  ? ? ?Cynthia Villarreal is a 21 y.o. female who presents via EMS with concern for seizure-like activity witnessed by family today.  Patient with reported history of seizures by family but no documented neurologic work-up in the system.  Patient presents unaccompanied during my initial evaluation.  She states that she was at the dentist today to begin to have left-sided chest pain but no difficulty breathing.  She states that when she was done at the dentist she went out to the car where her mother was waiting for her and reportedly had some sort of seizure like activity while in the car.  It sounds like it was full body shaking and lasted for approximately 1 minute before resolving spontaneously.  When EMS arrived patient was reportedly postictal. ? ?Patient is G2, P1, currently approximately [redacted] weeks pregnant following with Dr. Elgie Congo at [facility].  Patient had obstetric ultrasound on 12/18/2021 which revealed single live intrauterine pregnancy at 8 weeks 0 days gestation by crown-rump length.  LMP 10/20/2021.  ? ?I have personally reviewed this patient's medical records.  She has history of intrauterine growth restriction during her first pregnancy, STI during pregnancy, depression, anxiety, and recent breast mass for which she was seen with OB/GYN.  Per chart review patient is on cariprazine Arman Filter). ? ?HPI ? ?  ? ?Home Medications ?Prior to Admission medications   ?Medication Sig Start Date End Date Taking? Authorizing Provider  ?acetaminophen (TYLENOL) 325 MG tablet Take 2 tablets (650 mg total) by mouth every 4 (four) hours as needed (for pain scale < 4). ?Patient taking differently: Take 325 mg by mouth every 4 (four) hours as needed for moderate pain or headache (for pain scale < 4). 09/03/20  Yes Layla Barter, MD   ?Prenatal MV & Min w/FA-DHA (PRENATAL GUMMIES PO) Take 1 tablet by mouth in the morning and at bedtime.   Yes [provider]  ?Blood Pressure Monitoring (BLOOD PRESSURE KIT) DEVI 1 kit by Does not apply route once a week. 12/18/21   Griffin Basil, MD  ?Misc. Devices (GOJJI WEIGHT SCALE) MISC 1 Device by Does not apply route every 30 (thirty) days. 12/18/21   Griffin Basil, MD  ?   ? ?Allergies    ?Caffeine   ? ?Review of Systems   ?Review of Systems  ?Constitutional: Negative.   ?HENT: Negative.    ?Eyes:  Positive for visual disturbance.  ?     Reports blurry vision "all the time" when she is not wearing her glasses, though states it seems blurrier today than normal.   ?Respiratory: Negative.    ?Cardiovascular: Negative.   ?Gastrointestinal:  Positive for abdominal pain. Negative for diarrhea, nausea and vomiting.  ?Endocrine: Negative.   ?Genitourinary:  Positive for dysuria, flank pain, pelvic pain and vaginal discharge. Negative for decreased urine volume, vaginal bleeding and vaginal pain.  ?Skin: Negative.   ?Neurological:  Positive for seizures and headaches.  ?Hematological: Negative.   ? ?Physical Exam ?Updated Vital Signs ?BP 106/64   Pulse 97   Temp 100.3 ?F (37.9 ?C) (Oral)   Resp 20   Ht 5' 4"  (1.626 m)   Wt 63.5 kg   LMP 10/20/2021   SpO2 100%   BMI 24.03 kg/m?  ?Physical Exam ?Vitals and nursing note reviewed. Exam conducted  with a chaperone present.  ?Constitutional:   ?   Appearance: She is not ill-appearing or toxic-appearing.  ?HENT:  ?   Head: Normocephalic and atraumatic.  ?   Nose: Nose normal.  ?   Mouth/Throat:  ?   Mouth: Mucous membranes are moist.  ?   Dentition: Abnormal dentition. Dental tenderness, gingival swelling and dental caries present.  ?   Pharynx: Oropharynx is clear. Uvula midline. No oropharyngeal exudate or posterior oropharyngeal erythema.  ?   Tonsils: No tonsillar exudate.  ? ?Eyes:  ?   General: Lids are normal. Vision grossly intact.     ?    Right eye: No discharge.     ?   Left eye: No discharge.  ?   Extraocular Movements: Extraocular movements intact.  ?   Conjunctiva/sclera: Conjunctivae normal.  ?   Pupils: Pupils are equal, round, and reactive to light.  ?Neck:  ?   Trachea: Trachea and phonation normal.  ?Cardiovascular:  ?   Rate and Rhythm: Normal rate and regular rhythm.  ?   Pulses: Normal pulses.  ?   Heart sounds: Normal heart sounds. No murmur heard. ?Pulmonary:  ?   Effort: Pulmonary effort is normal. No tachypnea, bradypnea, accessory muscle usage, prolonged expiration or respiratory distress.  ?   Breath sounds: Normal breath sounds. No wheezing or rales.  ?Chest:  ?   Chest wall: No mass, lacerations, deformity, swelling, tenderness, crepitus or edema.  ?Abdominal:  ?   General: Bowel sounds are normal. There is no distension.  ?   Palpations: Abdomen is soft.  ?   Tenderness: There is generalized abdominal tenderness and tenderness in the right lower quadrant and suprapubic area. There is right CVA tenderness. There is no left CVA tenderness, guarding or rebound.  ?Genitourinary: ?   Exam position: Lithotomy position.  ?   Vagina: No foreign body. Vaginal discharge present. No erythema, tenderness or bleeding.  ?   Cervix: Normal.  ?   Uterus: Normal.   ?   Adnexa: Right adnexa normal and left adnexa normal.  ?   Comments: Moderate amount of thick white vaginal discharge present in the vaginal vault, without vaginal lesion ?Musculoskeletal:     ?   General: No deformity.  ?   Cervical back: Normal range of motion and neck supple.  ?   Right lower leg: No edema.  ?   Left lower leg: No edema.  ?Lymphadenopathy:  ?   Cervical: No cervical adenopathy.  ?Skin: ?   General: Skin is warm and dry.  ?   Capillary Refill: Capillary refill takes less than 2 seconds.  ?Neurological:  ?   General: No focal deficit present.  ?   Mental Status: She is alert and oriented to person, place, and time. Mental status is at baseline.  ?   GCS: GCS eye  subscore is 4. GCS verbal subscore is 5. GCS motor subscore is 6.  ?   Cranial Nerves: Cranial nerves 2-12 are intact.  ?   Sensory: Sensation is intact.  ?   Motor: Motor function is intact.  ?   Coordination: Coordination is intact.  ?   Gait: Gait is intact.  ?Psychiatric:     ?   Mood and Affect: Mood normal.  ? ? ?ED Results / Procedures / Treatments   ?Labs ?(all labs ordered are listed, but only abnormal results are displayed) ?Labs Reviewed  ?WET PREP, GENITAL - Abnormal; Notable for the following components:  ?  Result Value  ? WBC, Wet Prep HPF POC >=10 (*)   ? All other components within normal limits  ?COMPREHENSIVE METABOLIC PANEL - Abnormal; Notable for the following components:  ? Sodium 134 (*)   ? Potassium 3.1 (*)   ? BUN <5 (*)   ? All other components within normal limits  ?CBC WITH DIFFERENTIAL/PLATELET - Abnormal; Notable for the following components:  ? Neutro Abs 8.2 (*)   ? All other components within normal limits  ?URINALYSIS, ROUTINE W REFLEX MICROSCOPIC - Abnormal; Notable for the following components:  ? APPearance CLOUDY (*)   ? Hgb urine dipstick SMALL (*)   ? Ketones, ur 5 (*)   ? Nitrite POSITIVE (*)   ? Leukocytes,Ua LARGE (*)   ? WBC, UA >50 (*)   ? Bacteria, UA MANY (*)   ? All other components within normal limits  ?RAPID URINE DRUG SCREEN, HOSP PERFORMED - Abnormal; Notable for the following components:  ? Tetrahydrocannabinol POSITIVE (*)   ? All other components within normal limits  ?HCG, QUANTITATIVE, PREGNANCY - Abnormal; Notable for the following components:  ? hCG, Beta Neomia Dear 188,068 (*)   ? All other components within normal limits  ?POC URINE PREG, ED - Abnormal; Notable for the following components:  ? Preg Test, Ur POSITIVE (*)   ? All other components within normal limits  ?URINE CULTURE  ?MAGNESIUM  ?CBG MONITORING, ED  ?GC/CHLAMYDIA PROBE AMP ([place]) NOT AT [facility]  ?TROPONIN I (HIGH SENSITIVITY)  ? ? ?EKG ?EKG Interpretation ? ?Date/Time:  Monday  December 23 2021 17:08:36 EDT ?Ventricular Rate:  90 ?PR Interval:  122 ?QRS Duration: 82 ?QT Interval:  330 ?QTC Calculation: 404 ?R Axis:   81 ?Text Interpretation: Sinus rhythm Borderline repolarization abnormality Confir

## 2021-12-24 ENCOUNTER — Observation Stay (HOSPITAL_COMMUNITY): Payer: Medicaid Other

## 2021-12-24 DIAGNOSIS — N3000 Acute cystitis without hematuria: Secondary | ICD-10-CM | POA: Diagnosis not present

## 2021-12-24 DIAGNOSIS — R569 Unspecified convulsions: Secondary | ICD-10-CM | POA: Diagnosis not present

## 2021-12-24 DIAGNOSIS — Z3A11 11 weeks gestation of pregnancy: Secondary | ICD-10-CM | POA: Diagnosis not present

## 2021-12-24 DIAGNOSIS — E876 Hypokalemia: Secondary | ICD-10-CM | POA: Diagnosis present

## 2021-12-24 DIAGNOSIS — Z79899 Other long term (current) drug therapy: Secondary | ICD-10-CM | POA: Diagnosis not present

## 2021-12-24 DIAGNOSIS — Z8249 Family history of ischemic heart disease and other diseases of the circulatory system: Secondary | ICD-10-CM | POA: Diagnosis not present

## 2021-12-24 DIAGNOSIS — E871 Hypo-osmolality and hyponatremia: Secondary | ICD-10-CM | POA: Diagnosis present

## 2021-12-24 DIAGNOSIS — O99281 Endocrine, nutritional and metabolic diseases complicating pregnancy, first trimester: Secondary | ICD-10-CM | POA: Diagnosis present

## 2021-12-24 DIAGNOSIS — G4089 Other seizures: Secondary | ICD-10-CM | POA: Diagnosis present

## 2021-12-24 DIAGNOSIS — O99351 Diseases of the nervous system complicating pregnancy, first trimester: Secondary | ICD-10-CM | POA: Diagnosis present

## 2021-12-24 DIAGNOSIS — Z833 Family history of diabetes mellitus: Secondary | ICD-10-CM | POA: Diagnosis not present

## 2021-12-24 DIAGNOSIS — O2301 Infections of kidney in pregnancy, first trimester: Secondary | ICD-10-CM | POA: Diagnosis present

## 2021-12-24 DIAGNOSIS — F419 Anxiety disorder, unspecified: Secondary | ICD-10-CM | POA: Diagnosis present

## 2021-12-24 DIAGNOSIS — R32 Unspecified urinary incontinence: Secondary | ICD-10-CM | POA: Diagnosis present

## 2021-12-24 DIAGNOSIS — F410 Panic disorder [episodic paroxysmal anxiety] without agoraphobia: Secondary | ICD-10-CM | POA: Diagnosis present

## 2021-12-24 DIAGNOSIS — Z20822 Contact with and (suspected) exposure to covid-19: Secondary | ICD-10-CM | POA: Diagnosis present

## 2021-12-24 DIAGNOSIS — O99341 Other mental disorders complicating pregnancy, first trimester: Secondary | ICD-10-CM | POA: Diagnosis present

## 2021-12-24 DIAGNOSIS — K029 Dental caries, unspecified: Secondary | ICD-10-CM | POA: Diagnosis present

## 2021-12-24 LAB — GC/CHLAMYDIA PROBE AMP (~~LOC~~) NOT AT ARMC
Chlamydia: NEGATIVE
Comment: NEGATIVE
Comment: NORMAL
Neisseria Gonorrhea: NEGATIVE

## 2021-12-24 LAB — HIV ANTIBODY (ROUTINE TESTING W REFLEX): HIV Screen 4th Generation wRfx: NONREACTIVE

## 2021-12-24 MED ORDER — ACETAMINOPHEN 325 MG PO TABS
650.0000 mg | ORAL_TABLET | Freq: Four times a day (QID) | ORAL | Status: DC | PRN
  Administered 2021-12-24 – 2021-12-25 (×2): 650 mg via ORAL
  Filled 2021-12-24 (×2): qty 2

## 2021-12-24 MED ORDER — ENOXAPARIN SODIUM 40 MG/0.4ML IJ SOSY
40.0000 mg | PREFILLED_SYRINGE | INTRAMUSCULAR | Status: DC
  Administered 2021-12-24: 40 mg via SUBCUTANEOUS
  Filled 2021-12-24: qty 0.4

## 2021-12-24 MED ORDER — ORAL CARE MOUTH RINSE: 15.0000 mL | OROMUCOSAL | Status: DC

## 2021-12-24 MED ORDER — CHLORHEXIDINE GLUCONATE 0.12% ORAL RINSE (MEDLINE KIT)
15.0000 mL | Freq: Two times a day (BID) | OROMUCOSAL | Status: DC
  Administered 2021-12-25: 15 mL via OROMUCOSAL

## 2021-12-24 MED ORDER — ONDANSETRON 4 MG PO TBDP
4.0000 mg | ORAL_TABLET | Freq: Three times a day (TID) | ORAL | Status: DC | PRN
  Administered 2021-12-24: 4 mg via ORAL
  Filled 2021-12-24: qty 1

## 2021-12-24 MED ORDER — SODIUM CHLORIDE 0.9 % IV SOLN
1.0000 g | INTRAVENOUS | Status: DC
  Administered 2021-12-24: 1 g via INTRAVENOUS
  Filled 2021-12-24: qty 10

## 2021-12-24 NOTE — Assessment & Plan Note (Signed)
Urinalysis suggestive of UTI with clinical suspicion of right-sided pyelonephritis. ?-Continue IV ceftriaxone ?-Follow urine culture ?

## 2021-12-24 NOTE — Assessment & Plan Note (Signed)
Patient is approximately [redacted] weeks pregnant.  Will consult pharmacy to review medication safety. ?

## 2021-12-24 NOTE — ED Notes (Signed)
Pt's visitor came to nurses station and said they needed the nurse. Went into pt room and pt was lying on the bed w/ eyes closed. Pt's visitor says he left the room and came back in and found the pt like she is. Pt opened her eyes when asked to. Pt reports aura of anxiety and dry mouth and then reports her arms and legs started shaking. Pt reports she was aware of the seizure the entire time. Pt is in NAD and VSS. Notified admitting.  ?

## 2021-12-24 NOTE — ED Notes (Signed)
Called EEG to have monitoring equipment transported to pt new room on 3W ? ?

## 2021-12-24 NOTE — Assessment & Plan Note (Signed)
Neurology following.  Admission recommended for further evaluation, differential including first-time epileptic seizure versus pseudoseizure. ?-LTM EEG per neurology ?-Continue seizure precautions ?-Further management pending EEG result ?

## 2021-12-24 NOTE — Progress Notes (Signed)
Patient relocated to 3W21.  Notified [facility] monitoring.  EEG maintenance performed.  Patient event button tested.  No skin breakdown observed at electrode sites Fz, Fp1, Fp2.  ?

## 2021-12-24 NOTE — ED Notes (Signed)
ED TO INPATIENT HANDOFF REPORT ? ?ED Nurse Name and Phone #:  ? ?S ?Name/Age/Gender ?Cynthia Villarreal ?21 y.o. ?female ?Room/Bed: 035C/035C ? ?Code Status ?  Code Status: Full Code ? ?Home/SNF/Other ?Patient oriented to: self, place, time, and situation ?Is this baseline? Yes  ? ?Triage Complete: Triage complete  ?Chief Complaint ?Seizure-like activity ([place]) [R56.9] ? ?Triage Note ?Pt BIB [facility] for sz like activity. EMS states family saw pt have a "full body seizure." Pt has hx of focal sz. EMS report pt was postictal on assessment. Pt has c/o HA, nausea, and L sided chest discomfort. Pt is 10-11 weeks gravid. G2P1.  ? ?Pt also c/o cloudy and malodorous urine.   ? ?Allergies ?Allergies  ?Allergen Reactions  ? Caffeine Anxiety  ? ? ?Level of Care/Admitting Diagnosis ?ED Disposition   ? ? ED Disposition  ?Admit  ? Condition  ?--  ? Comment  ?Hospital Area: [facility] [id number] ? Level of Care: Telemetry Medical [104] ? May place patient in observation at [facility] or [facility] if equivalent level of care is available:: No ? Covid Evaluation: Asymptomatic - no recent exposure (last 10 days) testing not required ? Diagnosis: Seizure-like activity ([place]) [id number] ? Admitting Physician: Lenore Cordia [id number] ? Attending Physician: Lenore Cordia [id number] ?  ?  ? ?  ? ? ?B ?Medical/Surgery History ?Past Medical History:  ?Diagnosis Date  ? Anxiety   ? Depression   ? ?Past Surgical History:  ?Procedure Laterality Date  ? NO PAST SURGERIES    ?  ? ?A ?IV Location/Drains/Wounds ?Patient Lines/Drains/Airways Status   ? ? Active Line/Drains/Airways   ? ? Name Placement date Placement time Site Days  ? Peripheral IV 12/23/21 20 G 1" Right Antecubital 12/23/21  1729  Antecubital  1  ? External Urinary Catheter 12/24/21  0138  --  less than 1  ? ?  ?  ? ?  ? ? ?Intake/Output Last 24 hours ? ?Intake/Output Summary (Last 24 hours) at 12/24/2021 0937 ?Last data filed at 12/23/2021 1843 ?Gross per 24 hour  ?Intake  1000 ml  ?Output --  ?Net 1000 ml  ? ? ?Labs/Imaging ?Results for orders placed or performed during the hospital encounter of 12/23/21 (from the past 48 hour(s))  ?Comprehensive metabolic panel     Status: Abnormal  ? Collection Time: 12/23/21  4:59 PM  ?Result Value Ref Range  ? Sodium 134 (L) 135 - 145 mmol/L  ? Potassium 3.1 (L) 3.5 - 5.1 mmol/L  ? Chloride 101 98 - 111 mmol/L  ? CO2 23 22 - 32 mmol/L  ? Glucose, Bld 87 70 - 99 mg/dL  ?  Comment: Glucose reference range applies only to samples taken after fasting for at least 8 hours.  ? BUN <5 (L) 6 - 20 mg/dL  ? Creatinine, Ser 0.67 0.44 - 1.00 mg/dL  ? Calcium 9.6 8.9 - 10.3 mg/dL  ? Total Protein 7.9 6.5 - 8.1 g/dL  ? Albumin 4.0 3.5 - 5.0 g/dL  ? AST 16 15 - 41 U/L  ? ALT 8 0 - 44 U/L  ? Alkaline Phosphatase 71 38 - 126 U/L  ? Total Bilirubin 0.9 0.3 - 1.2 mg/dL  ? GFR, Estimated >60 >60 mL/min  ?  Comment: (NOTE) ?Calculated using the CKD-EPI Creatinine Equation (2021) ?  ? Anion gap 10 5 - 15  ?  Comment: Performed at [facility], [place] [address], [place], [place] [address]  ?CBC with  Differential/Platelet     Status: Abnormal  ? Collection Time: 12/23/21  4:59 PM  ?Result Value Ref Range  ? WBC 9.5 4.0 - 10.5 K/uL  ? RBC 4.52 3.87 - 5.11 MIL/uL  ? Hemoglobin 12.7 12.0 - 15.0 g/dL  ? HCT 39.3 36.0 - 46.0 %  ? MCV 86.9 80.0 - 100.0 fL  ? MCH 28.1 26.0 - 34.0 pg  ? MCHC 32.3 30.0 - 36.0 g/dL  ? RDW 13.7 11.5 - 15.5 %  ? Platelets 225 150 - 400 K/uL  ? nRBC 0.0 0.0 - 0.2 %  ? Neutrophils Relative % 86 %  ? Neutro Abs 8.2 (H) 1.7 - 7.7 K/uL  ? Lymphocytes Relative 9 %  ? Lymphs Abs 0.8 0.7 - 4.0 K/uL  ? Monocytes Relative 5 %  ? Monocytes Absolute 0.5 0.1 - 1.0 K/uL  ? Eosinophils Relative 0 %  ? Eosinophils Absolute 0.0 0.0 - 0.5 K/uL  ? Basophils Relative 0 %  ? Basophils Absolute 0.0 0.0 - 0.1 K/uL  ? Immature Granulocytes 0 %  ? Abs Immature Granulocytes 0.03 0.00 - 0.07 K/uL  ?  Comment: Performed at [facility], [place] [address],  [place], [place] [address]  ?Magnesium     Status: None  ? Collection Time: 12/23/21  4:59 PM  ?Result Value Ref Range  ? Magnesium 1.8 1.7 - 2.4 mg/dL  ?  Comment: Performed at [facility], [place] [address], [place], [place] [address]  ?Urinalysis, Routine w reflex microscopic     Status: Abnormal  ? Collection Time: 12/23/21  4:59 PM  ?Result Value Ref Range  ? Color, Urine YELLOW YELLOW  ? APPearance CLOUDY (A) CLEAR  ? Specific Gravity, Urine 1.009 1.005 - 1.030  ? pH 7.0 5.0 - 8.0  ? Glucose, UA NEGATIVE NEGATIVE mg/dL  ? Hgb urine dipstick SMALL (A) NEGATIVE  ? Bilirubin Urine NEGATIVE NEGATIVE  ? Ketones, ur 5 (A) NEGATIVE mg/dL  ? Protein, ur NEGATIVE NEGATIVE mg/dL  ? Nitrite POSITIVE (A) NEGATIVE  ? Leukocytes,Ua LARGE (A) NEGATIVE  ? RBC / HPF 0-5 0 - 5 RBC/hpf  ? WBC, UA >50 (H) 0 - 5 WBC/hpf  ? Bacteria, UA MANY (A) NONE SEEN  ? Squamous Epithelial / LPF 21-50 0 - 5  ? Mucus PRESENT   ?  Comment: Performed at [facility], [place] [address] [place], [place] [address]  ?Rapid urine drug screen (hospital performed)     Status: Abnormal  ? Collection Time: 12/23/21  4:59 PM  ?Result Value Ref Range  ? Opiates NONE DETECTED NONE DETECTED  ? Cocaine NONE DETECTED NONE DETECTED  ? Benzodiazepines NONE DETECTED NONE DETECTED  ? Amphetamines NONE DETECTED NONE DETECTED  ? Tetrahydrocannabinol POSITIVE (A) NONE DETECTED  ? Barbiturates NONE DETECTED NONE DETECTED  ?  Comment: (NOTE) ?DRUG SCREEN FOR MEDICAL PURPOSES ?ONLY.  IF CONFIRMATION IS NEEDED ?FOR ANY PURPOSE, NOTIFY LAB ?WITHIN 5 DAYS. ? ?LOWEST DETECTABLE LIMITS ?FOR URINE DRUG SCREEN ?Drug Class                     Cutoff (ng/mL) ?Amphetamine and metabolites    1000 ?Barbiturate and metabolites    200 ?Benzodiazepine                 200 ?Tricyclics and metabolites     300 ?Opiates and metabolites        300 ?Cocaine and metabolites  300 ?THC                            50 ?Performed at [facility], [place] [address], [place],  [place] ?[address] ?  ?hCG, quantitative, pregnancy     Status: Abnormal  ? Collection Time: 12/23/21  4:59 PM  ?Result Value Ref Range  ? hCG, Beta Chain, Quant, S 188,068 (H) <5 mIU/mL  ?  Comment:        ?  GEST. AGE      CONC.  (mIU/mL) ?  <=1 WEEK        5 - 50 ?    2 WEEKS       50 - 500 ?    3 WEEKS       100 - 10,000 ?    4 WEEKS     1,000 - 30,000 ?    5 WEEKS     3,500 - 115,000 ?  6-8 WEEKS     12,000 - 270,000 ?   12 WEEKS     15,000 - 220,000 ?       ?FEMALE AND NON-PREGNANT FEMALE: ?    LESS THAN 5 mIU/mL ?Performed at [facility], [place] [address] [place], [place] [address] ?  ?CBG monitoring, ED     Status: None  ? Collection Time: 12/23/21  5:13 PM  ?Result Value Ref Range  ? Glucose-Capillary 81 70 - 99 mg/dL  ?  Comment: Glucose reference range applies only to samples taken after fasting for at least 8 hours.  ? Comment 1 Notify RN   ? Comment 2 Document in Chart   ?Troponin I (High Sensitivity)     Status: None  ? Collection Time: 12/23/21  5:14 PM  ?Result Value Ref Range  ? Troponin I (High Sensitivity) 13 <18 ng/L  ?  Comment: (NOTE) ?Elevated high sensitivity troponin I (hsTnI) values and significant  ?changes across serial measurements may suggest ACS but many other  ?chronic and acute conditions are known to elevate hsTnI results.  ?Refer to the "Links" section for chest pain algorithms and additional  ?guidance. ?Performed at [facility], [place] [address] [place], [place] ?[address] ?  ?POC Urine Pregnancy, ED (not at [facility])     Status: Abnormal  ? Collection Time: 12/23/21  5:49 PM  ?Result Value Ref Range  ? Preg Test, Ur POSITIVE (A) NEGATIVE  ?  Comment:        ?THE SENSITIVITY OF THIS ?METHODOLOGY IS >24 mIU/mL ?  ?Wet prep, genital     Status: Abnormal  ? Collection Time: 12/23/21  8:07 PM  ? Specimen: PATH Cytology Cervicovaginal Ancillary Only  ?Result Value Ref Range  ? Yeast Wet Prep HPF POC NONE SEEN NONE SEEN  ? Trich, Wet Prep NONE SEEN NONE SEEN  ? Clue Cells Wet Prep HPF POC  NONE SEEN NONE SEEN  ? WBC, Wet Prep HPF POC >=10 (A) <10  ? Sperm NONE SEEN   ?  Comment: Performed at [facility], [place] [address], [place], [place] [address]  ? ?MR BRAIN WO CONTRAST ? ?Result Date: 3/2

## 2021-12-24 NOTE — ED Notes (Signed)
Placed breakfast order ?

## 2021-12-24 NOTE — Hospital Course (Signed)
Cynthia Villarreal is a G21P1 21 y.o. female with medical history significant for depression/anxiety who is [redacted] weeks pregnant is admitted for evaluation of seizure-like activity.  Started on LTM EEG per neurology. ?

## 2021-12-24 NOTE — Progress Notes (Signed)
Subjective: Had one event this morning which did not show any concomitant EEG change.  Patient reports having anxiety before and increased rest recently ? ?ROS: negative except above ? ?Examination ? ?Vital signs in last 24 hours: ?Temp:  [98.6 ?F (37 ?C)-100.3 ?F (37.9 ?C)] 98.6 ?F (37 ?C) (03/21 [id number]) ?Pulse Rate:  [81-98] 81 (03/21 1230) ?Resp:  [16-26] 17 (03/21 1230) ?BP: (93-125)/(54-70) 93/63 (03/21 1230) ?SpO2:  [96 %-100 %] 100 % (03/21 1230) ?Weight:  [63.5 kg] 63.5 kg (03/20 1701) ? ?General: lying in bed, NAD ?CVS: pulse-normal rate and rhythm ?RS: breathing comfortably ?Extremities: normal  ? ?Neuro: ?MS: Alert, oriented, follows commands ?CN: pupils equal and reactive,  EOMI, face symmetric, tongue midline, normal sensation over face, ?Motor: 5/5 strength in all 4 extremities ? ?Basic Metabolic Panel: ?Recent Labs  ?Lab 12/23/21 ?[id number]  ?NA 134*  ?K 3.1*  ?CL 101  ?CO2 23  ?GLUCOSE 87  ?BUN <5*  ?CREATININE 0.67  ?CALCIUM 9.6  ?MG 1.8  ? ? ?CBC: ?Recent Labs  ?Lab 12/23/21 ?[id number]  ?WBC 9.5  ?NEUTROABS 8.2*  ?HGB 12.7  ?HCT 39.3  ?MCV 86.9  ?PLT 225  ? ? ? ?Coagulation Studies: ?No results for input(s): LABPROT, INR in the last 72 hours. ? ?Imaging ? ?MRI brain without contrast 12/23/2021: Normal ? ? ?ASSESSMENT AND PLAN: 21 year old F with new onset seizure-like activity in a G2P1 female who is 10-[redacted] weeks pregnant. ? 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 ?Recommendations ?We will continue LTM EEG overnight for characterization of spells ?-Will not start any AEDs unless we see any evidence of epilepsy on EEG due to low suspicion for epilepsy ?-Continue seizure precautions ?-Avoid giving Ativan unless patient seizure-like episode is prolonged (more than 15 minutes) ?-Management of rest of comorbidities per primary team ?-Discussed plan with patient and significant other at bedside ? ?I have spent a total of 26  minutes with the patient reviewing hospital notes,  test  results, labs and examining the patient as well as establishing an assessment and plan that was discussed personally with the patient.  > 50% of time was spent in direct patient care. ?  ? ?Zeb Comfort ?Epilepsy ?[facility] ?For questions after 5pm please refer to AMION to reach the Neurologist on call ? ?

## 2021-12-24 NOTE — Assessment & Plan Note (Signed)
Supplement orally. 

## 2021-12-24 NOTE — Progress Notes (Signed)
vLTM setup  [facility] not monitoring  ED patient   All impedances below 10kohms  Pt event button tested ?

## 2021-12-24 NOTE — Progress Notes (Addendum)
MEDICATION RELATED CONSULT NOTE - INITIAL  ? ?Pharmacy Consult for medication safety in preganancy ? ?This list and assessment contains drugs patient is currently on and has already received during this admission. ? ?Medications:  ?Chlorhexidine gluconate ?MEDLINE mouth rinse ?Ondansetron injection ?Ceftriaxone IV ?Enoxaparin [place] injection ?Acetaminophen ?LR ?KCl ? ?Assessment: ?Pharmacy was consulted to review patients medications and determine safety in pregnancy. Only medications with potential safety concerns were listed in this assessment, all other medications were found to be safe. ? ?Ondansetron: potential increases in cardiac anomalies when used in the first trimester. Doxylamine-pyridoxine should be treatment of choice for N/V in pregnancy. If ondansetron must be used, avoiding the 1st trimester is the safest course. [place] ? ?Acetaminophen: low risk, spot dosing okay, avoid regular use. ? ?Augmentin: amoxicillin generally considered safe in pregnancy. One study reported an association between Augmentin and necrotizing enterocolitis when the combination was used near pre-term birth. This association requires confirmation. ? ?Recommendations of medication safety in pregnancy made using [employer] in Pregnancy and Lactation Eleventh Edition. ? ?Alver Fisher, PharmD Candidate [phone] ?12/24/2021,1:06 AM ? ? ? ?

## 2021-12-24 NOTE — Progress Notes (Signed)
?PROGRESS NOTE ? ? ? ?Cynthia Villarreal  [id number]:[id number] DOB: December 10, 2000 DOA: 12/23/2021 ?PCP: Department, [facility]  ? ? ?Brief Narrative:  ?21 year old G2, P1 with history of anxiety, currently [redacted] weeks pregnant presents to the ER with seizure-like activity.  Patient went to dentist earlier in the day and was prescribed Augmentin, she got into the car but does not remember what happened next.  Her boyfriend who was with her who witnessed her having a staring spell with several seconds of unresponsiveness.  Became alert, went to sleep and woke up again with legs and arms extended bilaterally, eyes rolling back for about 1 minute.  Somehow confused after that.  No injuries.  EMS was called and brought to ER.  Patient was also complaining of right flank discomfort and urinary frequency.  In the emergency room awake.  No neurological deficit.  Urinalysis abnormal.  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 ? ? ?Assessment & Plan: ?  ?Seizure-like activity, suspecting nonepileptic seizure: ?Currently neurologically stable. ?EEG spot was normal.  MRI of the brain was essentially normal. ?Patient currently on long-term monitoring, she had 1 event of extremity tightness with no loss of consciousness today, EEG did not capture any seizure activities. ?Neurology following, recommended ongoing EEG monitoring. ?Seizure precautions.  Fall precautions.  Currently no indication for antiseizure medications. ?As needed Ativan to be given to abort seizures only if persistent more than 5 minutes with generalized symptoms. ? ?Acute UTI, suspected present on admission: Right upper quadrant ultrasound was essentially normal.  Clinically stable.  On Rocephin.  Urine cultures pending. ? ?Hypokalemia: Replaced. ? ?First trimester pregnancy: Stable. ? ? ?DVT prophylaxis: enoxaparin (LOVENOX) injection 40 mg Start: 12/24/21 2000 ? ? ?Code Status: Full code ?Family Communication: None ?Disposition Plan:  Status is: Observation ?The patient will require care spanning > 2 midnights and should be moved to inpatient because: Seizure in a patient with first trimester pregnancy, ongoing long-term neurological monitoring. ?  ? ? ?Consultants:  ?Neurology ? ?Procedures:  ?LTM EEG ? ?Antimicrobials:  ?Rocephin 3/20--- ? ? ?Subjective: ?Examined patient in the morning rounds and denied any overnight complaints.  Mild right flank pain present.  Denies any nausea vomiting or fever.  She wanted to go home as she has 51-year-old child at home. ?She had 1 episode of shakiness, extremity tiredness.  Patient agreeable to stay in the hospital for monitoring tonight. ? ? ?Objective: ?Vitals:  ? 12/24/21 1100 12/24/21 1230 12/24/21 1300 12/24/21 1330  ?BP: 108/66 93/63 107/66 102/68  ?Pulse: 91 81 88 85  ?Resp: 17 17 19    ?Temp:      ?TempSrc:      ?SpO2: 99% 100% 100% 100%  ?Weight:      ?Height:      ? ? ?Intake/Output Summary (Last 24 hours) at 12/24/2021 1355 ?Last data filed at 12/23/2021 1843 ?Gross per 24 hour  ?Intake 1000 ml  ?Output --  ?Net 1000 ml  ? ?Filed Weights  ? 12/23/21 1701  ?Weight: 63.5 kg  ? ? ?Examination: ? ?General exam: Appears calm and comfortable  ?Respiratory system: Clear to auscultation. Respiratory effort normal. ?Cardiovascular system: S1 & S2 heard, RRR. No JVD, murmurs, rubs, gallops or clicks. No pedal edema. ?Gastrointestinal system: Abdomen is nondistended, soft and nontender. No organomegaly or masses felt. Normal bowel sounds heard. ?Central nervous system: Alert and oriented. No focal neurological deficits. ?Extremities: Symmetric 5 x 5 power. ?Skin: No rashes, lesions or ulcers ?Psychiatry: Judgement and insight appear normal. Mood &  affect appropriate.  ? ? ? ?Data Reviewed: I have personally reviewed following labs and imaging studies ? ?CBC: ?Recent Labs  ?Lab 12/23/21 ?[id number]  ?WBC 9.5  ?NEUTROABS 8.2*  ?HGB 12.7  ?HCT 39.3  ?MCV 86.9  ?PLT 225  ? ?Basic Metabolic Panel: ?Recent Labs  ?Lab  12/23/21 ?[id number]  ?NA 134*  ?K 3.1*  ?CL 101  ?CO2 23  ?GLUCOSE 87  ?BUN <5*  ?CREATININE 0.67  ?CALCIUM 9.6  ?MG 1.8  ? ?GFR: ?Estimated Creatinine Clearance: 96.9 mL/min (by C-G formula based on SCr of 0.67 mg/dL). ?Liver Function Tests: ?Recent Labs  ?Lab 12/23/21 ?[id number]  ?AST 16  ?ALT 8  ?ALKPHOS 71  ?BILITOT 0.9  ?PROT 7.9  ?ALBUMIN 4.0  ? ?No results for input(s): LIPASE, AMYLASE in the last 168 hours. ?No results for input(s): AMMONIA in the last 168 hours. ?Coagulation Profile: ?No results for input(s): INR, PROTIME in the last 168 hours. ?Cardiac Enzymes: ?No results for input(s): CKTOTAL, CKMB, CKMBINDEX, TROPONINI in the last 168 hours. ?BNP (last 3 results) ?No results for input(s): PROBNP in the last 8760 hours. ?HbA1C: ?No results for input(s): HGBA1C in the last 72 hours. ?CBG: ?Recent Labs  ?Lab 12/23/21 ?1713  ?GLUCAP 81  ? ?Lipid Profile: ?No results for input(s): CHOL, HDL, LDLCALC, TRIG, CHOLHDL, LDLDIRECT in the last 72 hours. ?Thyroid Function Tests: ?No results for input(s): TSH, T4TOTAL, FREET4, T3FREE, THYROIDAB in the last 72 hours. ?Anemia Panel: ?No results for input(s): VITAMINB12, FOLATE, FERRITIN, TIBC, IRON, RETICCTPCT in the last 72 hours. ?Sepsis Labs: ?No results for input(s): PROCALCITON, LATICACIDVEN in the last 168 hours. ? ?Recent Results (from the past 240 hour(s))  ?Wet prep, genital     Status: Abnormal  ? Collection Time: 12/23/21  8:07 PM  ? Specimen: PATH Cytology Cervicovaginal Ancillary Only  ?Result Value Ref Range Status  ? Yeast Wet Prep HPF POC NONE SEEN NONE SEEN Final  ? Trich, Wet Prep NONE SEEN NONE SEEN Final  ? Clue Cells Wet Prep HPF POC NONE SEEN NONE SEEN Final  ? WBC, Wet Prep HPF POC >=10 (A) <10 Final  ? Sperm NONE SEEN  Final  ?  Comment: Performed at [facility], [address] [place], [place] [address]  ?  ? ? ? ? ? ?Radiology Studies: ?MR BRAIN WO CONTRAST ? ?Result Date: 12/23/2021 ?CLINICAL DATA:  Seizure EXAM: MRI HEAD WITHOUT CONTRAST  TECHNIQUE: Multiplanar, multiecho pulse sequences of the brain and surrounding structures were obtained without intravenous contrast. COMPARISON:  None. FINDINGS: Brain: No acute infarct, mass effect or extra-axial collection. No acute or chronic hemorrhage. Normal white matter signal, parenchymal volume and CSF spaces. The midline structures are normal. The hippocampi are normal and symmetric in size and signal. The hypothalamus and mamillary bodies are normal. There is no cortical ectopia or dysplasia. Vascular: Major flow voids are preserved. Skull and upper cervical spine: Normal calvarium and skull base. Visualized upper cervical spine and soft tissues are normal. Sinuses/Orbits:No paranasal sinus fluid levels or advanced mucosal thickening. No mastoid or middle ear effusion. Normal orbits. IMPRESSION: Normal brain MRI. Electronically Signed   By: Ulyses Jarred M.D.   On: 12/23/2021 21:47  ? ?Overnight EEG with video ? ?Result Date: 12/24/2021 ?Lora Havens, MD     12/24/2021  8:57 AM Patient Name: Cynthia Villarreal MRN: [id number] Epilepsy Attending: Lora Havens Referring Physician/Provider: Kerney Elbe, MD Duration: 12/24/2021 0020 to 0900 Patient history: 21yo F with new onset seizure-like activity  in a G2P1 female who is 10-[redacted] weeks pregnant.  EEG to evaluate for seizure. Level of alertness: Awake, asleep AEDs during EEG study: None Technical aspects: This EEG study was done with scalp electrodes positioned according to the 10-20 International system of electrode placement. Electrical activity was acquired at a sampling rate of 500Hz  and reviewed with a high frequency filter of 70Hz  and a low frequency filter of 1Hz . EEG data were recorded continuously and digitally stored. Description: The posterior dominant rhythm consists of 10 Hz activity of moderate voltage (25-35 uV) seen predominantly in posterior head regions, symmetric and reactive to eye opening and eye closing. Sleep was characterized by  vertex waves, sleep spindles (12 to 14 Hz), maximal frontocentral region. Hyperventilation and photic stimulation were not performed.   IMPRESSION: This study is within normal limits. No seizures or epileptiform disc

## 2021-12-24 NOTE — Procedures (Addendum)
Patient Name: Cynthia Villarreal  ?MRN: [id number]  ?Epilepsy Attending: Lora Havens  ?Referring Physician/Provider: Kerney Elbe, MD  ?Duration: 12/24/2021 0020 to 12/25/2021 0020 ? ?Patient history: 21yo F with new onset seizure-like activity in a G2P1 female who is 10-[redacted] weeks pregnant.  EEG to evaluate for seizure. ? ?Level of alertness: Awake, asleep ? ?AEDs during EEG study: None ? ?Technical aspects: This EEG study was done with scalp electrodes positioned according to the 10-20 International system of electrode placement. Electrical activity was acquired at a sampling rate of 500Hz  and reviewed with a high frequency filter of 70Hz  and a low frequency filter of 1Hz . EEG data were recorded continuously and digitally stored.  ? ?Description: The posterior dominant rhythm consists of 10 Hz activity of moderate voltage (25-35 uV) seen predominantly in posterior head regions, symmetric and reactive to eye opening and eye closing. Sleep was characterized by vertex waves, sleep spindles (12 to 14 Hz), maximal frontocentral region. Hyperventilation and photic stimulation were not performed.   ? ?One event was recorded on 12/24/2021 at around 1050 during which patient reported feeling anxious, dry mouth as well as shaking of arms and legs.  Concomitant EEG before, during and after the event showed normal posterior dominant rhythm. ? ?IMPRESSION: ?This study is within normal limits. No seizures or epileptiform discharges were seen throughout the recording. ? ?One event was recorded on 12/23/2021 at around 1050 as described above without concomitant EEG change.  This was a nonepileptic event. ? ? ?Tyaisha Cullom Barbra Sarks  ? ?

## 2021-12-24 NOTE — ED Notes (Signed)
Per Ghimire MD, he is aware and neuro will eval seizure like activity on EEG. Will continue to monitor pt ?

## 2021-12-25 DIAGNOSIS — N3000 Acute cystitis without hematuria: Secondary | ICD-10-CM | POA: Diagnosis not present

## 2021-12-25 DIAGNOSIS — E876 Hypokalemia: Secondary | ICD-10-CM | POA: Diagnosis not present

## 2021-12-25 DIAGNOSIS — R569 Unspecified convulsions: Secondary | ICD-10-CM | POA: Diagnosis not present

## 2021-12-25 LAB — BASIC METABOLIC PANEL
Anion gap: 9 (ref 5–15)
BUN: <5 mg/dL — ABNORMAL LOW (ref 6–20)
CO2: 20 mmol/L — ABNORMAL LOW (ref 22–32)
Calcium: 9.3 mg/dL (ref 8.9–10.3)
Chloride: 103 mmol/L (ref 98–111)
Creatinine, Ser: 0.65 mg/dL (ref 0.44–1.00)
GFR, Estimated: >60 mL/min (ref >60)
Glucose, Bld: 100 mg/dL — ABNORMAL HIGH (ref 70–99)
Potassium: 3.4 mmol/L — ABNORMAL LOW (ref 3.5–5.1)
Sodium: 132 mmol/L — ABNORMAL LOW (ref 135–145)

## 2021-12-25 MED ORDER — AMOXICILLIN 500 MG PO CAPS: 500.0000 mg | ORAL_CAPSULE | Freq: Two times a day (BID) | ORAL | 0 refills | Status: AC

## 2021-12-25 MED ORDER — POTASSIUM CHLORIDE 20 MEQ PO PACK
20.0000 meq | PACK | Freq: Once | ORAL | Status: AC
  Administered 2021-12-25: 20 meq via ORAL
  Filled 2021-12-25: qty 1

## 2021-12-25 MED ORDER — POTASSIUM CHLORIDE CRYS ER 20 MEQ PO TBCR
40.0000 meq | EXTENDED_RELEASE_TABLET | Freq: Once | ORAL | Status: DC
  Filled 2021-12-25: qty 2

## 2021-12-25 NOTE — Progress Notes (Signed)
Subjective: No further episodes overnight.  Denies any new concerns. ? ?ROS: negative except above ? ?Examination ? ?Vital signs in last 24 hours: ?Temp:  [99 ?F (37.2 ?C)-101.2 ?F (38.4 ?C)] 99.4 ?F (37.4 ?C) (03/22 1105) ?Pulse Rate:  [81-104] 98 (03/22 1105) ?Resp:  [12-21] 18 (03/22 1105) ?BP: (93-113)/(54-69) 112/69 (03/22 1105) ?SpO2:  [100 %] 100 % (03/22 1105) ? ?General: lying in bed, NAD ?CVS: pulse-normal rate and rhythm ?RS: breathing comfortably ?Extremities: normal  ?  ?Neuro: ?MS: Alert, oriented, follows commands ?CN: pupils equal and reactive,  EOMI, face symmetric, tongue midline, normal sensation over face, ?Motor: 5/5 strength in all 4 extremities ? ?Basic Metabolic Panel: ?Recent Labs  ?Lab 12/23/21 ?1659  ?NA 134*  ?K 3.1*  ?CL 101  ?CO2 23  ?GLUCOSE 87  ?BUN <5*  ?CREATININE 0.67  ?CALCIUM 9.6  ?MG 1.8  ? ? ?CBC: ?Recent Labs  ?Lab 12/23/21 ?1659  ?WBC 9.5  ?NEUTROABS 8.2*  ?HGB 12.7  ?HCT 39.3  ?MCV 86.9  ?PLT 225  ? ? ? ?Coagulation Studies: ?No results for input(s): LABPROT, INR in the last 72 hours. ? ?Imaging ?No new brain imaging overnight ? ? ?ASSESSMENT AND PLAN: 21 year old F with new onset seizure-like activity in a G2P1 female who is 10-[redacted] weeks pregnant. ?  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  ?Recommendations ?-Discontinue LTM EEG  as no further events and eeg normal so far ?-Will not start any AEDs  ?-Discussed the diagnosis of nonepileptic events with patient and management strategies including cognitive behavioral therapy ?-Discussed seizure precautions including do not drive for 6 months/until cleared by physician ?-Follow-up with psychiatry for management of anxiety ? ?Seizure precautions: ?Per [facility] statutes, patients with seizures are not allowed to drive until they have been seizure-free for six months and cleared by a physician  ?  ?Use caution when using heavy equipment or power tools. Avoid working on ladders or at heights. Take  showers instead of baths. Ensure the water temperature is not too high on the home water heater. Do not go swimming alone. Do not lock yourself in a room alone (i.e. bathroom). When caring for infants or small children, sit down when holding, feeding, or changing them to minimize risk of injury to the child in the event you have a seizure. Maintain good sleep hygiene. Avoid alcohol.  ?  ?If patient has another seizure, call 911 and bring them back to the ED if: ?A.  The seizure lasts longer than 5 minutes.      ?B.  The patient doesn't wake shortly after the seizure or has new problems such as difficulty seeing, speaking or moving following the seizure ?C.  The patient was injured during the seizure ?D.  The patient has a temperature over 102 F (39C) ?E.  The patient vomited during the seizure and now is having trouble breathing ?   ?During the Seizure ?  ?- First, ensure adequate ventilation and place patients on the floor on their left side  ?Loosen clothing around the neck and ensure the airway is patent. If the patient is clenching the teeth, do not force the mouth open with any object as this can cause severe damage ?- Remove all items from the surrounding that can be hazardous. The patient may be oblivious to what's happening and may not even know what he or she is doing. ?If the patient is confused and wandering, either gently guide him/her away and block access to outside areas ?- Reassure  the individual and be comforting ?- Call 911. In most cases, the seizure ends before EMS arrives. However, there are cases when seizures may last over 3 to 5 minutes. Or the individual may have developed breathing difficulties or severe injuries. If a pregnant patient or a person with diabetes develops a seizure, it is prudent to call an ambulance. ?- Finally, if the patient does not regain full consciousness, then call EMS. Most patients will remain confused for about 45 to 90 minutes after a seizure, so you must use  judgment in calling for help. ?- Avoid restraints but make sure the patient is in a bed with padded side rails ?- Place the individual in a lateral position with the neck slightly flexed; this will help the saliva drain from the mouth and prevent the tongue from falling backward ?- Remove all nearby furniture and other hazards from the area ?- Provide verbal assurance as the individual is regaining consciousness ?- Provide the patient with privacy if possible ?- Call for help and start treatment as ordered by the caregiver ?  ? After the Seizure (Postictal Stage) ?  ?After a seizure, most patients experience confusion, fatigue, muscle pain and/or a headache. Thus, one should permit the individual to sleep. For the next few days, reassurance is essential. Being calm and helping reorient the person is also of importance. ?  ?Most seizures are painless and end spontaneously. Seizures are not harmful to others but can lead to complications such as stress on the lungs, brain and the heart. Individuals with prior lung problems may develop labored breathing and respiratory distress.  ? ?  ?I have spent a total of 40 minutes with the patient reviewing hospital notes,  test results, labs and examining the patient as well as establishing an assessment and plan that was discussed personally with the patient.  > 50% of time was spent in direct patient care. ? ?Lindie Spruce ?Epilepsy ?[facility] ?For questions after 5pm please refer to AMION to reach the Neurologist on call ? ?

## 2021-12-25 NOTE — TOC Transition Note (Signed)
Transition of Care (TOC) - CM/SW Discharge Note ? ? ?Patient Details  ?Name: Cynthia Villarreal ?MRN: [id number] ?Date of Birth: [birth year] ? ?Transition of Care (TOC) CM/SW Contact:  ?Kermit Balo, RN ?Phone Number: ?12/25/2021, 4:28 PM ? ? ?Clinical Narrative:    ?Discharging home with self care. No needs per TOC. ? ? ?Final next level of care: Home/Self Care ?Barriers to Discharge: No Barriers Identified ? ? ?Patient Goals and CMS Choice ?  ?  ?  ? ?Discharge Placement ?  ?           ?  ?  ?  ?  ? ?Discharge Plan and Services ?  ?  ?           ?  ?  ?  ?  ?  ?  ?  ?  ?  ?  ? ?Social Determinants of Health (SDOH) Interventions ?  ? ? ?Readmission Risk Interventions ?   ? View : No data to display.  ?  ?  ?  ? ? ? ? ? ?

## 2021-12-25 NOTE — Progress Notes (Addendum)
Patient Name: Cynthia Villarreal  ?MRN: [id number]  ?Epilepsy Attending: Charlsie Quest  ?Referring Physician/Provider: Caryl Pina, MD  ?Duration: 12/25/2021 0020 to 12/25/2021 1105 ?  ?Patient history: 21yo F with new onset seizure-like activity in a G2P1 female who is 10-[redacted] weeks pregnant.  EEG to evaluate for seizure. ?  ?Level of alertness: Awake, asleep ?  ?AEDs during EEG study: None ?  ?Technical aspects: This EEG study was done with scalp electrodes positioned according to the 10-20 International system of electrode placement. Electrical activity was acquired at a sampling rate of 500Hz  and reviewed with a high frequency filter of 70Hz  and a low frequency filter of 1Hz . EEG data were recorded continuously and digitally stored.  ?  ?Description: The posterior dominant rhythm consists of 10 Hz activity of moderate voltage (25-35 uV) seen predominantly in posterior head regions, symmetric and reactive to eye opening and eye closing. Sleep was characterized by vertex waves, sleep spindles (12 to 14 Hz), maximal frontocentral region. Hyperventilation and photic stimulation were not performed.   ?  ?IMPRESSION: ?This study is within normal limits. No seizures or epileptiform discharges were seen throughout the recording. ? ?  ?

## 2021-12-25 NOTE — Progress Notes (Signed)
LTM EEG discontinued - no skin breakdown at unhook.   

## 2021-12-26 LAB — URINE CULTURE: Culture: >=100000 — AB

## 2021-12-26 NOTE — Discharge Summary (Signed)
?Physician Discharge Summary ?  ?Patient: Cynthia Villarreal MRN: [id number] DOB: Sep 21, 2001  ?Admit date:     12/23/2021  ?Discharge date: 12/25/2021  ?Discharge Physician: Hosie Poisson  ? ?PCP: Department, [facility]  ? ?Recommendations at discharge:  ?Please follow up with oB GYN in one week ?Please follow up with cbc /bmp in one week.  ?Please follow up with PCP in one week.  ?Recommend outpatient follow up with psychiatry for management of anxiety.  ? ? ?Discharge Diagnoses: ?Principal Problem: ?  Seizure-like activity ([place]) ?Active Problems: ?  UTI (urinary tract infection) ?  Pregnancy ?  Hypokalemia ?  Seizure ([place]) ? ? ? ?Hospital Course: ?21 year old G2, P1 with history of anxiety, currently [redacted] weeks pregnant presents to the ER with seizure-like activity.  Patient went to dentist earlier in the day and was prescribed Augmentin, she got into the car but does not remember what happened next.  Her boyfriend who was with her who witnessed her having a staring spell with several seconds of unresponsiveness.  Became alert, went to sleep and woke up again with legs and arms extended bilaterally, eyes rolling back for about 1 minute.  Somehow confused after that.  No injuries.  EMS was called and brought to ER.  Patient was also complaining of right flank discomfort and urinary frequency.  In the emergency room awake.  No neurological deficit.  Urinalysis abnormal.  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 ?  ? ?Assessment and Plan: ? ?Seizure-like activity, suspecting nonepileptic seizure: ?Currently neurologically stable. ?EEG spot was normal.  MRI of the brain was essentially normal. ?Patient currently on long-term monitoring, she had 1 event of extremity tightness with no loss of consciousness today, EEG did not capture any seizure activities. ?Neurology following, recommended ongoing EEG monitoring. ?Seizure precautions.  Fall precautions.  Currently no indication for  antiseizure medications. ?Neurology did not recommend any AED'S as EEG is normal.  ?No driving for 6 months.  ? ?  ?Acute UTI, suspected present on admission: Right upper quadrant ultrasound was essentially normal.  Clinically stable.  On Rocephin transitioned to amoxicillin.  ?  ?Hypokalemia: Replaced. ?  ?First trimester pregnancy: Stable. Outpatient follow up with OB/GYN in one week.  ?  ? ? ? ? ?Consultants: NEUROLOGY ?Procedures performed: EEG  ?Disposition: Home ?Diet recommendation:  ?Discharge Diet Orders (From admission, onward)  ? ?  Start     Ordered  ? 12/25/21 0000  Diet - low sodium heart healthy       ? 12/25/21 1552  ? ?  ?  ? ?  ? ?Regular diet ?DISCHARGE MEDICATION: ?Allergies as of 12/25/2021   ? ?   Reactions  ? Caffeine Anxiety  ? ?  ? ?  ?Medication List  ?  ? ?STOP taking these medications   ? ?Blood Pressure Kit Devi ?  ? ?  ? ?TAKE these medications   ? ?acetaminophen 325 MG tablet ?Commonly known as: Tylenol ?Take 2 tablets (650 mg total) by mouth every 4 (four) hours as needed (for pain scale < 4). ?What changed:  ?how much to take ?reasons to take this ?  ?amoxicillin 500 MG capsule ?Commonly known as: AMOXIL ?Take 1 capsule (500 mg total) by mouth 2 (two) times daily for 7 days. ?  ?Gojji Weight Scale Misc ?1 Device by Does not apply route every 30 (thirty) days. ?  ?PRENATAL GUMMIES PO ?Take 1 tablet by mouth in the morning and at bedtime. ?  ? ?  ? ?  Follow-up Information   ? ? Department, [facility] Follow up in 1 week(s).   ?Contact information: ?[address] ?[place] [address] ?[phone] ? ? ?  ?  ? ?  ?  ? ?  ? ?Discharge Exam: ?Filed Weights  ? 12/23/21 1701  ?Weight: 63.5 kg  ? ?General exam: Appears calm and comfortable  ?Respiratory system: Clear to auscultation. Respiratory effort normal. ?Cardiovascular system: S1 & S2 heard, RRR. No JVD, murmurs, rubs, gallops or clicks. No pedal edema. ?Gastrointestinal system: Abdomen is nondistended, soft and  nontender. No organomegaly or masses felt. Normal bowel sounds heard. ?Central nervous system: Alert and oriented. No focal neurological deficits. ?Extremities: Symmetric 5 x 5 power. ?Skin: No rashes, lesions or ulcers ?Psychiatry: Judgement and insight appear normal. Mood & affect appropriate.  ? ? ?Condition at discharge: fair ? ?The results of significant diagnostics from this hospitalization (including imaging, microbiology, ancillary and laboratory) are listed below for reference.  ? ?Imaging Studies: ?MR BRAIN WO CONTRAST ? ?Result Date: 12/23/2021 ?CLINICAL DATA:  Seizure EXAM: MRI HEAD WITHOUT CONTRAST TECHNIQUE: Multiplanar, multiecho pulse sequences of the brain and surrounding structures were obtained without intravenous contrast. COMPARISON:  None. FINDINGS: Brain: No acute infarct, mass effect or extra-axial collection. No acute or chronic hemorrhage. Normal white matter signal, parenchymal volume and CSF spaces. The midline structures are normal. The hippocampi are normal and symmetric in size and signal. The hypothalamus and mamillary bodies are normal. There is no cortical ectopia or dysplasia. Vascular: Major flow voids are preserved. Skull and upper cervical spine: Normal calvarium and skull base. Visualized upper cervical spine and soft tissues are normal. Sinuses/Orbits:No paranasal sinus fluid levels or advanced mucosal thickening. No mastoid or middle ear effusion. Normal orbits. IMPRESSION: Normal brain MRI. Electronically Signed   By: Kevin  Herman M.D.   On: 12/23/2021 21:47  ? ?US OB Limited ? ?Result Date: 12/23/2021 ?----------------------------------------------------------------------  OBSTETRICS REPORT                       (Signed Final 12/23/2021 02:48 pm) ---------------------------------------------------------------------- Patient Info  ID #:       [id number]                          D.O.B.:  03/23/2001 (21 yrs)  Name:       Cynthia Villarreal                  Visit Date: 12/18/2021 11:11  am ---------------------------------------------------------------------- Performed By  Attending:        Lawrence Bass MD       [facility]  Performed By:     Ariel Burch RN           [facility]  Referred By:      Griffin Basil MD ---------------------------------------------------------------------- Orders  #  Description                           Code        Ordered By  1  US OB LIMITED                         26834.1     Lynnda Shields ----------------------------------------------------------------------  #  Order #                     Accession #                Episode #  1  [id number]                   [id number]                 [id number] ---------------------------------------------------------------------- Indications  [redacted] weeks gestation of pregnancy                 Z3A.08 ---------------------------------------------------------------------- Fetal Evaluation  Num Of Fetuses:         1  Fetal Heart Rate(bpm):  160  Cardiac Activity:       Observed  Comment:    Viability confirmed, FHT 160 ---------------------------------------------------------------------- Biometry  CRL:      16.8  mm     G. Age:  8w 0d                   EDD:   07/30/22 ---------------------------------------------------------------------- Gestational Age  Best:          8w 0d      Det. By:  U/S C R L (12/18/21)     EDD:   07/30/22 ---------------------------------------------------------------------- Comments  Single live IUP at 92w0dby CMadison LMP provided  10/20/21.Technically limited exam due to early Gestational  Age.  ----------------------------------------------------------------------                 LLynnda Shields MD Electronically Signed Final Report   12/23/2021 02:48 pm ---------------------------------------------------------------------- ? ?OCristina Gong

## 2022-01-06 ENCOUNTER — Other Ambulatory Visit: Payer: Medicaid Other

## 2022-01-15 ENCOUNTER — Encounter: Payer: Medicaid Other | Admitting: Obstetrics and Gynecology

## 2022-01-23 ENCOUNTER — Inpatient Hospital Stay: Admission: RE | Admit: 2022-01-23 | Payer: Medicaid Other | Source: Ambulatory Visit

## 2022-01-26 IMAGING — US US MFM UA CORD DOPPLER
1 series · 12 of 28 positions shown · non-contrast
Comparison: none

[Series 1: us mfm ua cord doppler · 40 acquisitions, 12 frames shown]
[im 2/40]
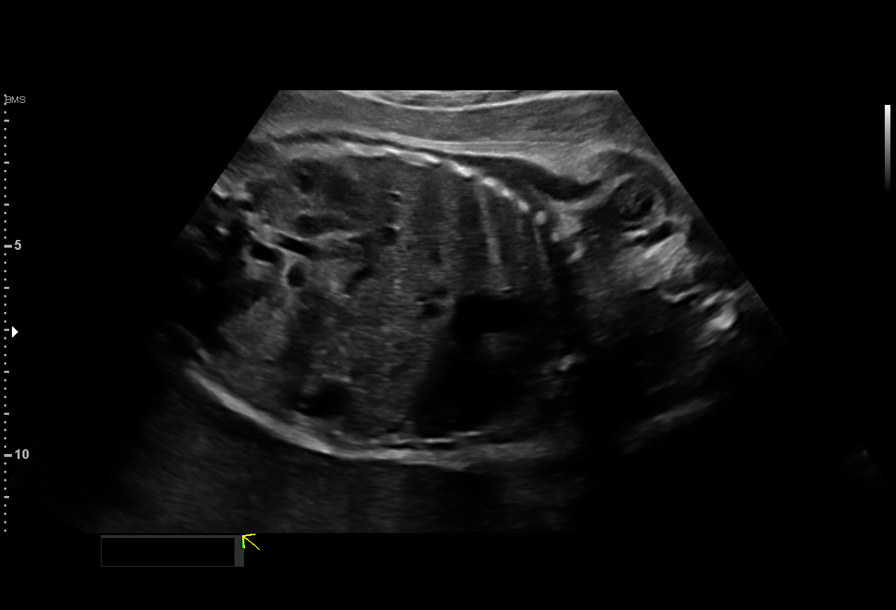
[im 5/40]
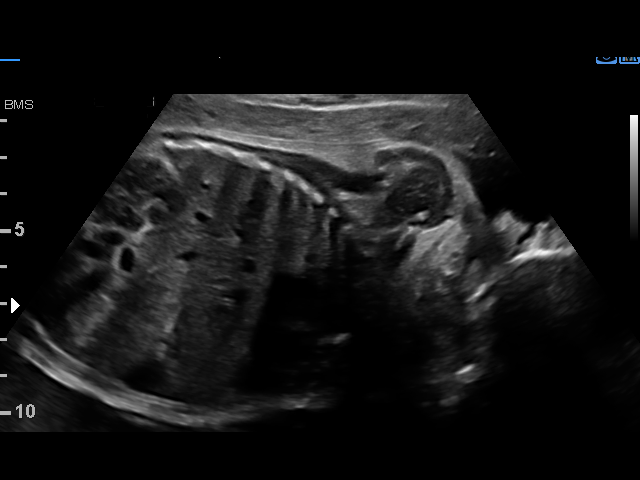
[im 8/40]
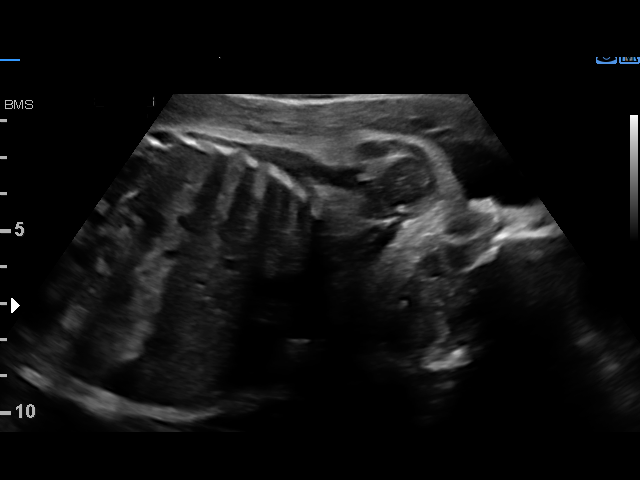
[im 12/40]
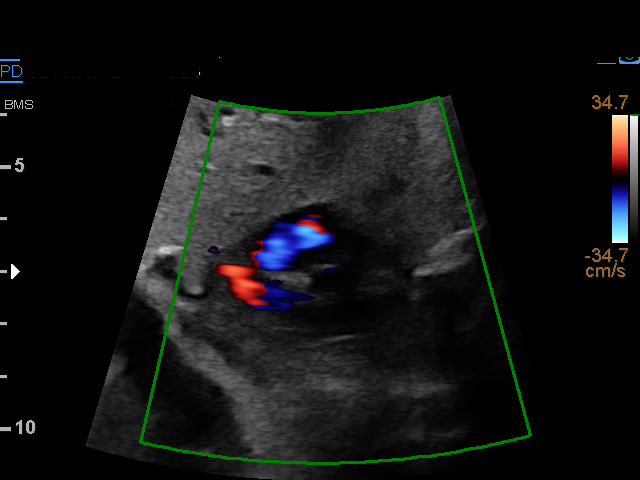
[im 15/40]
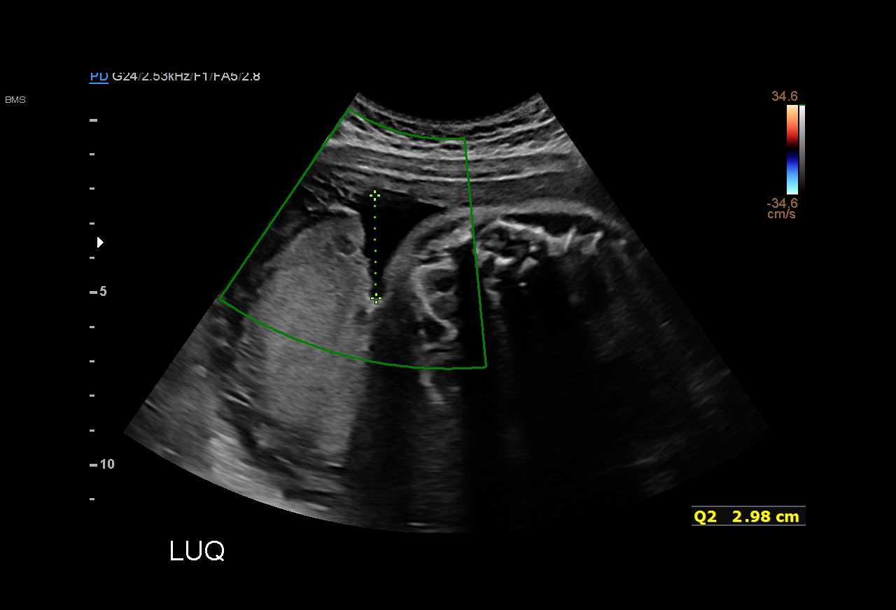
[im 18/40]
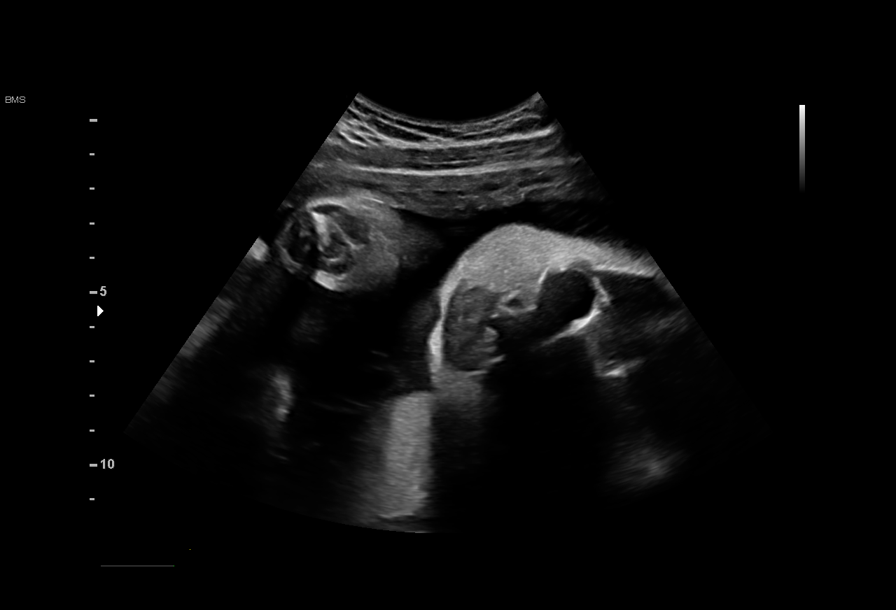
[im 22/40]
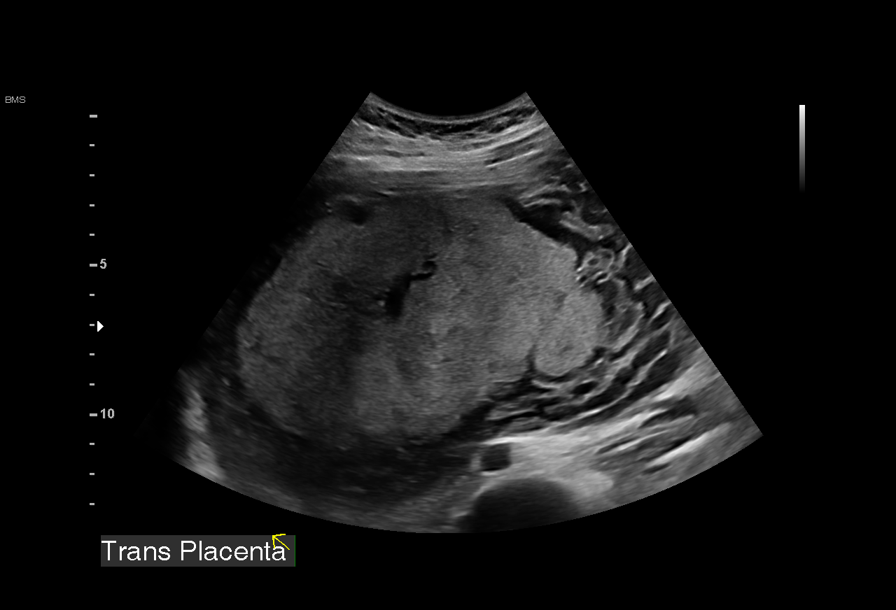
[im 25/40]
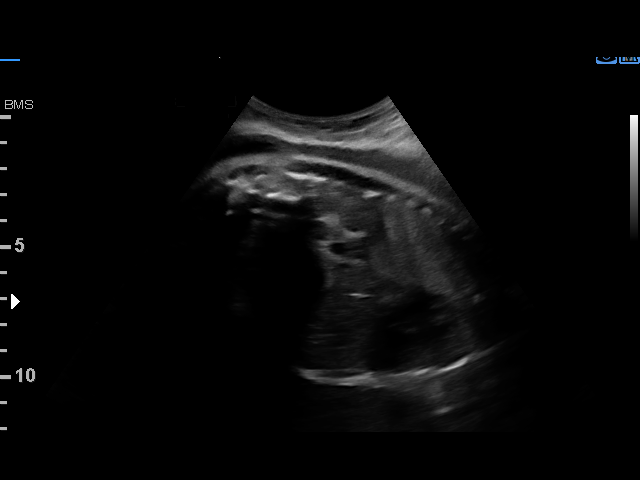
[im 28/40]
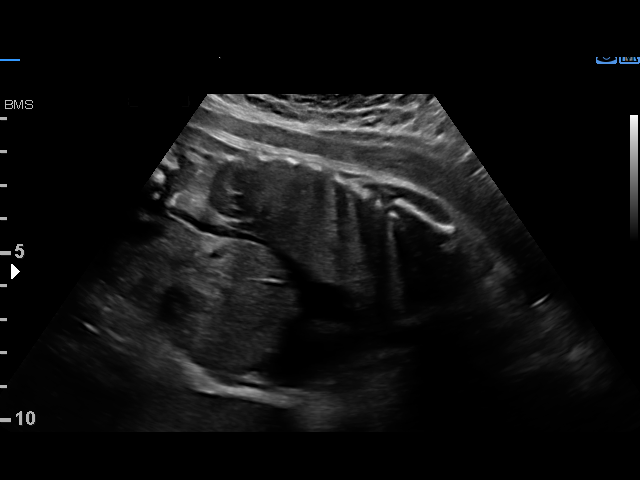
[im 32/40]
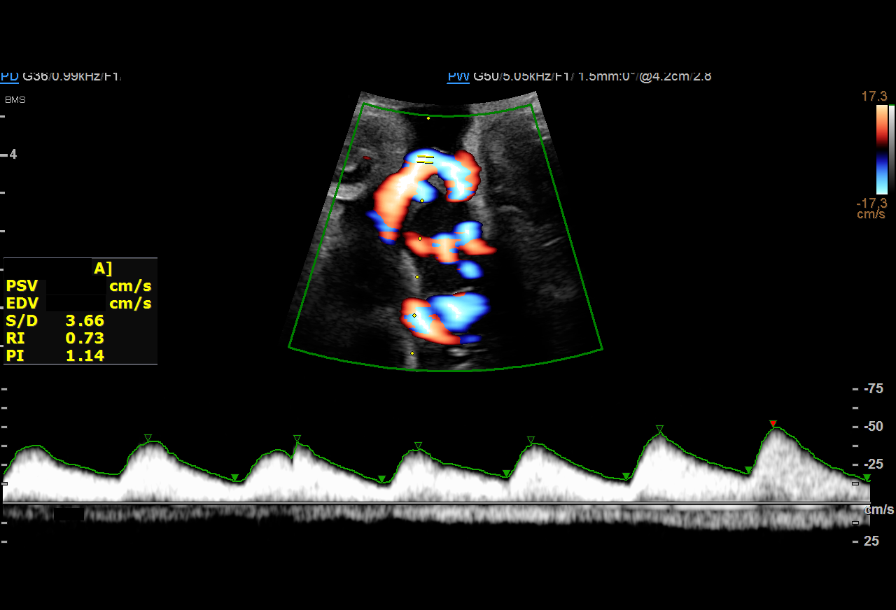
[im 35/40]
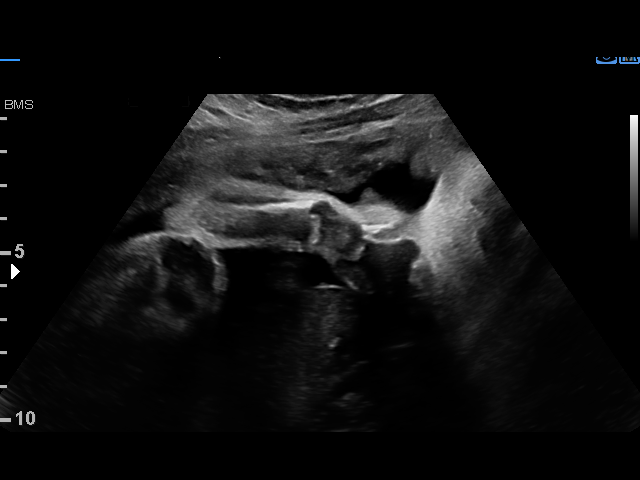
[im 38/40]
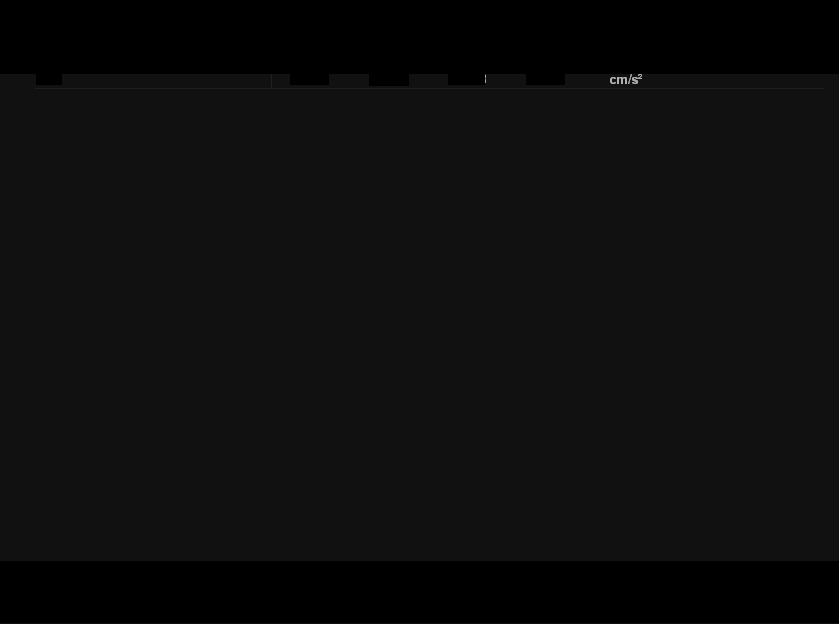

[12 of 28 positions shown; findings below may reference images not displayed]

W/NONSTRESS

Indications

 Maternal care for known or suspected poor
 fetal growth, third trimester, not applicable or
 unspecified IUGR
 NIPS:Low risk, Fem, FF:12%,   Genetic
 screening: Negative
 32 weeks gestation of pregnancy
Fetal Evaluation

 Num Of Fetuses:         1
 Fetal Heart Rate(bpm):  152
 Cardiac Activity:       Observed
 Presentation:           Cephalic
 Placenta:               Posterior

 Amniotic Fluid
 AFI FV:      Within normal limits

 AFI Sum(cm)     %Tile       Largest Pocket(cm)
 11.43           28

 RUQ(cm)       RLQ(cm)       LUQ(cm)        LLQ(cm)

Biophysical Evaluation

 Amniotic F.V:   Pocket => 2 cm             F. Tone:        Observed
 F. Movement:    Observed                   N.S.T:          Reactive
 F. Breathing:   Observed                   Score:          [DATE]
Biometry

 LV:          4  mm
OB History

 Gravidity:    1
Gestational Age

 LMP:           32w 4d        Date:  12/12/19                 EDD:   09/17/20
 Best:          32w 4d     Det. By:  LMP  (12/12/19)          EDD:   09/17/20
Anatomy

 Ventricles:            Appears normal         Kidneys:                Appear normal
 Diaphragm:             Appears normal         Bladder:                Appears normal
 Stomach:               Appears normal, left
                        sided
Doppler - Fetal Vessels

 Umbilical Artery
  S/D     %tile      RI    %tile                             ADFV    RDFV
  3.66       92    0.73       93                                No      No

Cervix Uterus Adnexa

 Cervix
 Not visualized (advanced GA >46wks)
Comments

 This patient was seen due to a severe IUGR fetus.  She
 denies any problems since her last exam.  She reports
 feeling vigorous fetal movements throughout the day.
 A biophysical profile performed today was [DATE].  The
 patient also had a reactive nonstress test for her gestational
 age making her total biophysical profile score [DATE].
 There was normal amniotic fluid noted on today's ultrasound
 exam.
 Doppler studies of the umbilical arteries performed due to
 fetal growth restriction showed a normal S/D ratio of 3.66.
 There were no signs of absent or reversed end-diastolic flow
 noted today.
 Another biophysical profile was scheduled in 1 week.

## 2022-02-05 ENCOUNTER — Encounter: Payer: Medicaid Other | Admitting: Obstetrics and Gynecology

## 2022-02-05 ENCOUNTER — Institutional Professional Consult (permissible substitution): Payer: Medicaid Other | Admitting: Licensed Clinical Social Worker

## 2022-02-09 IMAGING — US US MFM FETAL BPP W/ NON-STRESS
1 series · 14 of 23 positions shown · non-contrast
Comparison: none

[Series 1: us mfm fetal bpp w/ non-stress · 23 acquisitions, 14 frames shown]
[im 1/23]
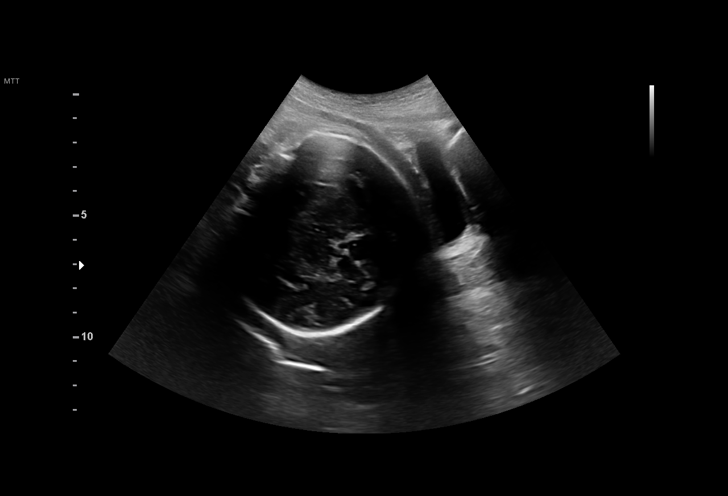
[im 3/23]
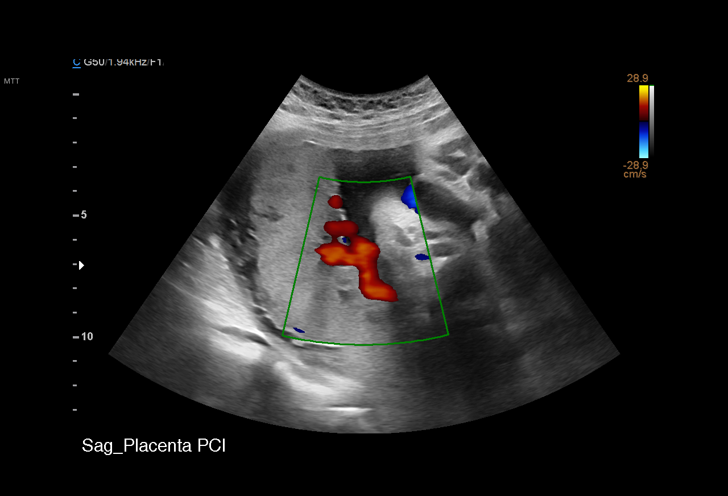
[im 5/23]
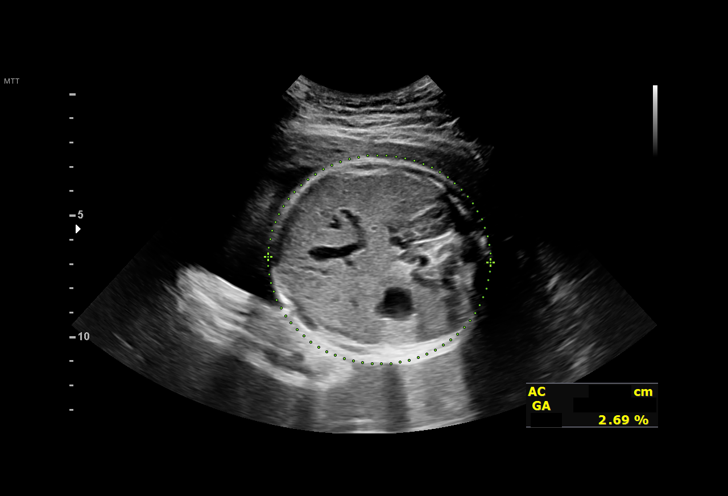
[im 6/23]
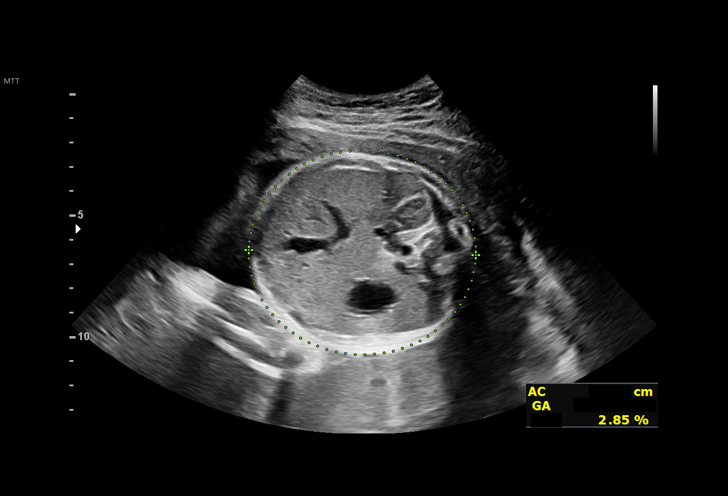
[im 8/23]
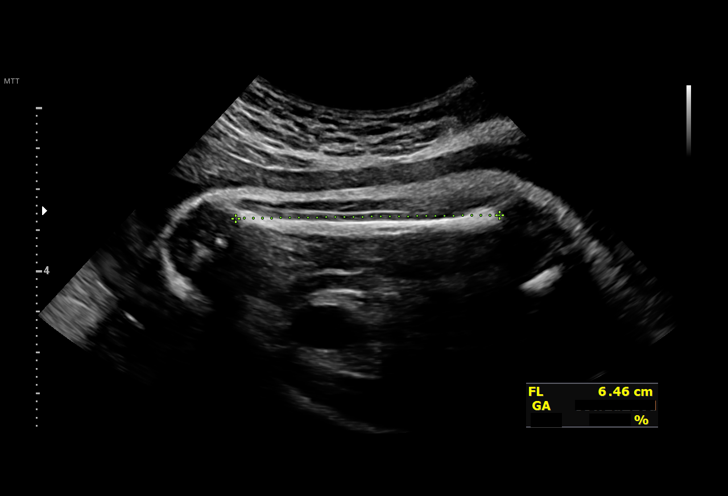
[im 10/23]
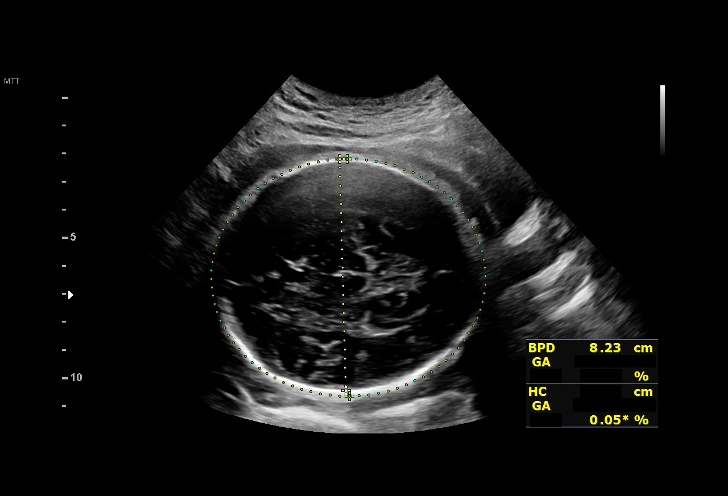
[im 11/23]
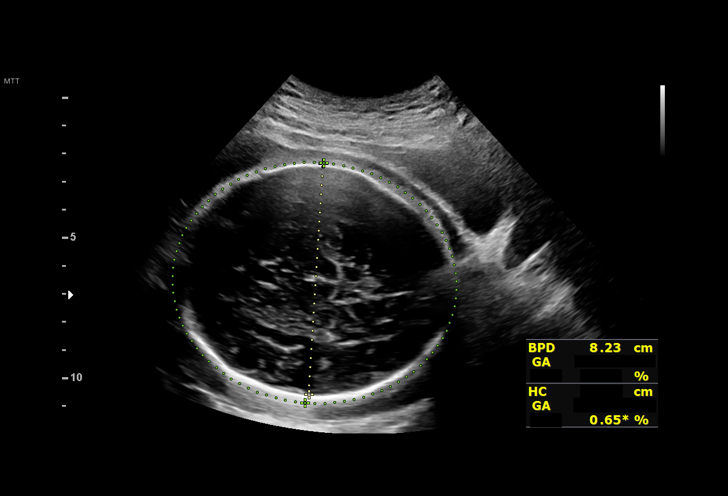
[im 13/23]
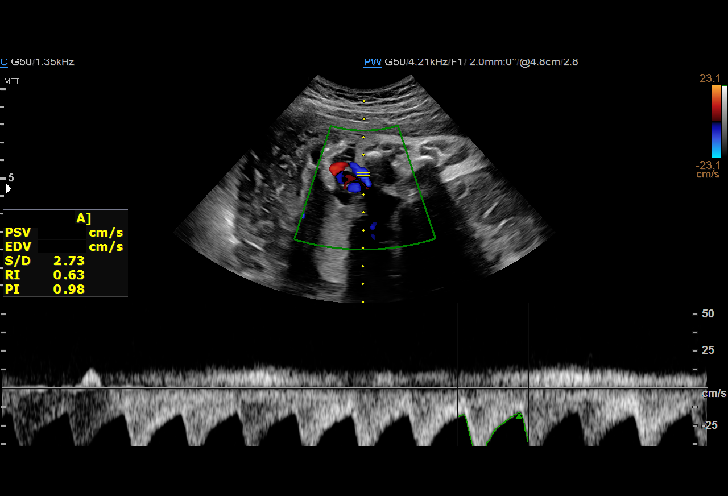
[im 14/23]
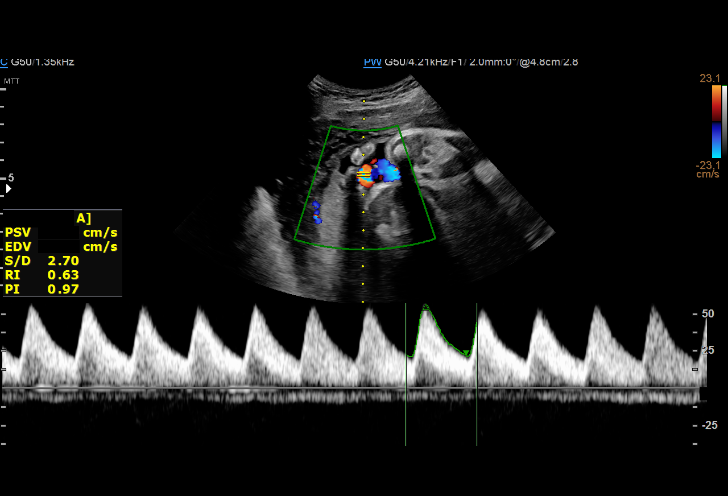
[im 16/23]
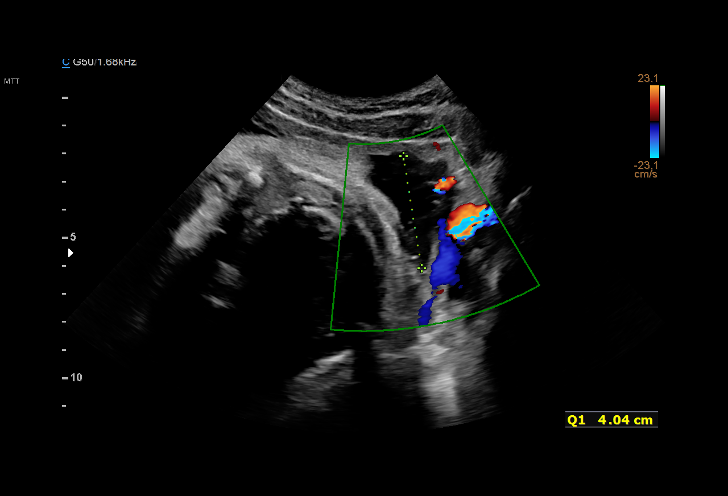
[im 18/23]
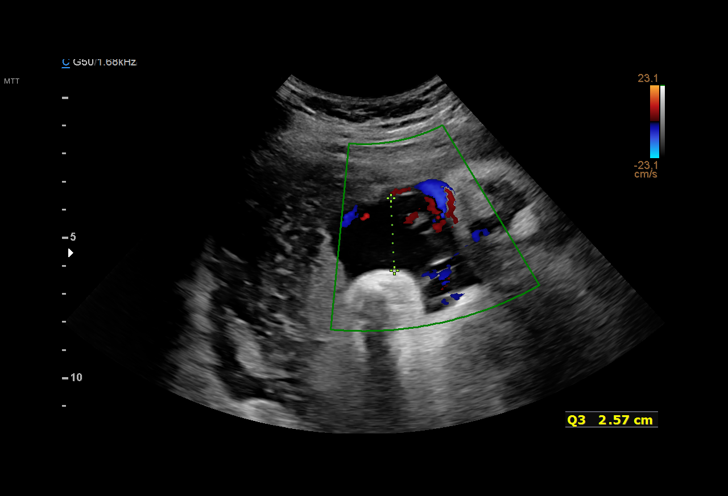
[im 19/23]
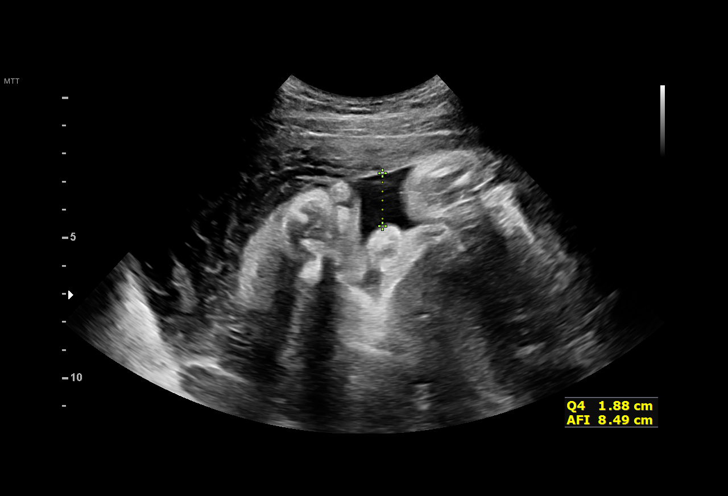
[im 21/23]
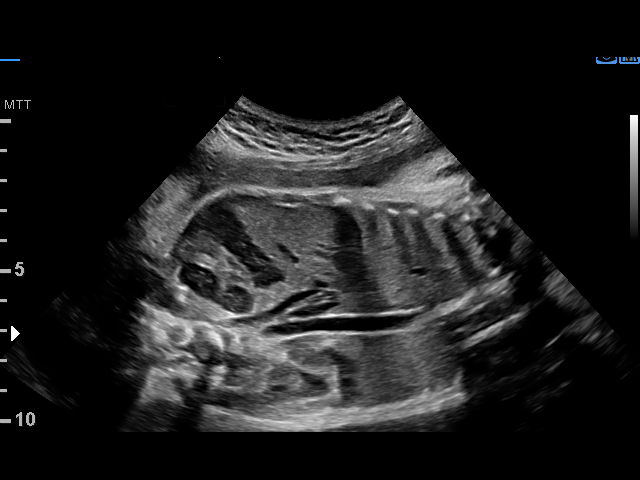
[im 23/23]
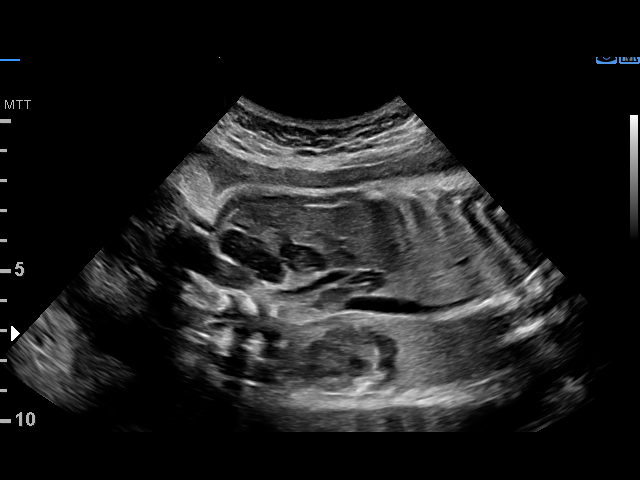

[14 of 23 positions shown; findings below may reference images not displayed]

W/NONSTRESS

Indications

 Maternal care for known or suspected poor
 fetal growth, third trimester, fetus 1 IUGR
 34 weeks gestation of pregnancy
Fetal Evaluation

 Num Of Fetuses:         1
 Cardiac Activity:       Observed
 Presentation:           Cephalic
 Placenta:               Posterior
 P. Cord Insertion:      Visualized, central

 Amniotic Fluid
 AFI FV:      Subjectively low-normal

 AFI Sum(cm)     %Tile       Largest Pocket(cm)
 8.49            9

 RUQ(cm)       RLQ(cm)       LUQ(cm)        LLQ(cm)
 4.04          1.88          0
Biophysical Evaluation

 Amniotic F.V:   Pocket => 2 cm             F. Tone:        Observed
 F. Movement:    Observed                   N.S.T:          Reactive
 F. Breathing:   Observed                   Score:          [DATE]
Biometry

 BPD:      82.2  mm     G. Age:  33w 0d         12  %    CI:         79.1   %    70 - 86
                                                         FL/HC:      21.8   %    20.1 -
 HC:      292.2  mm     G. Age:  32w 2d        < 1  %    HC/AC:      1.05        0.93 -
 AC:      278.3  mm     G. Age:  31w 6d        2.6  %    FL/BPD:     77.5   %    71 - 87
 FL:       63.7  mm     G. Age:  32w 6d          8  %    FL/AC:      22.9   %    20 - 24

 Est. FW:    8228  gm      4 lb 5 oz    4.1  %
OB History

 Gravidity:    1
Gestational Age

 LMP:           34w 4d        Date:  12/12/19                 EDD:   09/17/20
 U/S Today:     32w 4d                                        EDD:   10/01/20
 Best:          34w 4d     Det. By:  LMP  (12/12/19)          EDD:   09/17/20
Anatomy

 Cranium:               Appears normal         Posterior Fossa:        Appears normal
 Cavum:                 Appears normal         Heart:                  Appears normal
                                                                       (4CH, axis, and
                                                                       situs)
 Ventricles:            Appears normal         Diaphragm:              Appears normal
 Choroid Plexus:        Appears normal         Stomach:                Appears normal, left
                                                                       sided
 Cerebellum:            Appears normal         Bladder:                Appears normal

 Other:  Other anatomy previously imaged and appeared normal.
Doppler - Fetal Vessels

 Umbilical Artery
  S/D     %tile      RI    %tile                             ADFV    RDFV
  2.71       63    0.63       69                                No      No

Comments

 This patient was seen due to an IUGR fetus.  She denies any
 problems since her last exam.  She reports feeling fetal
 movements throughout the day.
 The fetal biometry measurements obtained today continues
 to show fetal growth restriction with an EFW of 4 pounds 5
 ounces (4th percentile for her gestational age).
 A biophysical profile performed today was [DATE].  The
 patient also had a reactive NST today making her total
 biophysical profile score [DATE].
 There was normal amniotic fluid noted on today's ultrasound
 exam.
 Doppler studies of the umbilical arteries performed due to
 fetal growth restriction showed a normal S/D ratio of 2.71.
 There were no signs of absent or reversed end-diastolic flow
 noted today.
 She will return in 1 week for another biophysical profile and
 umbilical artery Doppler studies.
 The goal for her delivery would be at around 37 weeks.

## 2022-02-27 ENCOUNTER — Encounter: Payer: Medicaid Other | Admitting: Obstetrics & Gynecology

## 2022-02-27 ENCOUNTER — Institutional Professional Consult (permissible substitution): Payer: Medicaid Other | Admitting: Licensed Clinical Social Worker

## 2022-02-27 DIAGNOSIS — Z3480 Encounter for supervision of other normal pregnancy, unspecified trimester: Secondary | ICD-10-CM

## 2022-03-24 ENCOUNTER — Telehealth: Payer: Self-pay

## 2022-03-25 ENCOUNTER — Encounter: Payer: Medicaid Other | Admitting: Obstetrics & Gynecology

## 2022-03-25 ENCOUNTER — Ambulatory Visit (INDEPENDENT_AMBULATORY_CARE_PROVIDER_SITE_OTHER): Payer: Medicaid Other | Admitting: Licensed Clinical Social Worker

## 2022-03-25 DIAGNOSIS — Z8659 Personal history of other mental and behavioral disorders: Secondary | ICD-10-CM | POA: Diagnosis not present

## 2022-03-25 DIAGNOSIS — F439 Reaction to severe stress, unspecified: Secondary | ICD-10-CM

## 2022-03-27 NOTE — BH Specialist Note (Signed)
Integrated Behavioral Health via Telemedicine Visit  03/27/2022 Cynthia Villarreal [id number]  Number of Integrated Behavioral Health Clinician visits: 1 Session Start time:   Session End time:  Total time in minutes:   Referring Provider: 1018am Patient/Family location: 1050am [facility] Provider location: 32 mins via phone per pt request  All persons participating in visit: Pt Cynthia Villarreal and LCSW A. Demarr Kluever  Types of Service: Individual psychotherapy and Telephone visit  I connected with Cynthia Villarreal and/or Cynthia Villarreal's n/a via  Telephone or Video Enabled Telemedicine Application  (Video is Caregility application) and verified that I am speaking with the correct person using two identifiers. Discussed confidentiality: Yes   I discussed the limitations of telemedicine and the availability of in person appointments.  Discussed there is a possibility of technology failure and discussed alternative modes of communication if that failure occurs.  I discussed that engaging in this telemedicine visit, they consent to the provision of behavioral healthcare and the services will be billed under their insurance.  Patient and/or legal guardian expressed understanding and consented to Telemedicine visit: Yes   Presenting Concerns: Patient and/or family reports the following symptoms/concerns: history of depression, limited access to transportation, stress Duration of problem: over one year ; Severity of problem: mild  Patient and/or Family's Strengths/Protective Factors: Concrete supports in place (healthy food, safe environments, etc.)  Goals Addressed: Patient will:  Reduce symptoms of: depression   Increase knowledge and/or ability of: coping skills and stress reduction   Demonstrate ability to: Increase healthy adjustment to current life circumstances  Progress towards Goals: Ongoing  Interventions: Interventions utilized:  Supportive Counseling and Link to [employer] Standardized Assessments completed: PHQ 9  Patient and/or Family Response: Cynthia Villarreal responded well to visit.   Assessment: Patient has a history of depression. Cynthia Villarreal reports no access to transportation and limited social support are current stressors. Cynthia Villarreal reports intermittent depressed mood, trouble sleeping and loss of interest.     Patient may benefit from integrated behavioral health.  Plan: Follow up with behavioral health clinician on : 04/29/2022 Behavioral recommendations: Begin mindfulness and relaxation technique, journal writing to identify triggers, prioritize rest and keep medical appts  Referral(s): Integrated [employer] (In Clinic)  I discussed the assessment and treatment plan with the patient and/or parent/guardian. They were provided an opportunity to ask questions and all were answered. They agreed with the plan and demonstrated an understanding of the instructions.   They were advised to call back or seek an in-person evaluation if the symptoms worsen or if the condition fails to improve as anticipated.  Gwyndolyn Saxon, LCSW

## 2022-04-28 ENCOUNTER — Telehealth: Payer: Self-pay | Admitting: Licensed Clinical Social Worker

## 2022-04-28 NOTE — Telephone Encounter (Signed)
Called pt for appt reminder. Mother answered the phone advised patient is not available. Left message with just my name and phone number and ask for a return call. LCSW A. Felton Clinton did not disclose nature of call.

## 2022-04-29 ENCOUNTER — Ambulatory Visit: Payer: Medicaid Other | Admitting: Licensed Clinical Social Worker

## 2022-04-29 ENCOUNTER — Encounter: Payer: Medicaid Other | Admitting: Family Medicine

## 2022-05-26 ENCOUNTER — Telehealth: Payer: Self-pay | Admitting: Obstetrics & Gynecology

## 2022-05-27 ENCOUNTER — Encounter: Payer: Medicaid Other | Admitting: Family Medicine

## 2022-06-04 ENCOUNTER — Institutional Professional Consult (permissible substitution): Payer: Medicaid Other | Admitting: Licensed Clinical Social Worker

## 2022-06-04 ENCOUNTER — Other Ambulatory Visit (HOSPITAL_COMMUNITY)
Admission: RE | Admit: 2022-06-04 | Discharge: 2022-06-04 | Disposition: A | Discharge status: Home / Self Care | Payer: Medicaid Other | Source: Ambulatory Visit | Attending: Obstetrics & Gynecology | Admitting: Obstetrics & Gynecology

## 2022-06-04 ENCOUNTER — Ambulatory Visit (INDEPENDENT_AMBULATORY_CARE_PROVIDER_SITE_OTHER): Payer: Medicaid Other | Admitting: Obstetrics & Gynecology

## 2022-06-04 ENCOUNTER — Other Ambulatory Visit: Payer: Medicaid Other

## 2022-06-04 ENCOUNTER — Encounter: Payer: Self-pay | Admitting: Obstetrics & Gynecology

## 2022-06-04 VITALS — BP 118/67 | HR 110 | Wt 152.3 lb

## 2022-06-04 DIAGNOSIS — O0933 Supervision of pregnancy with insufficient antenatal care, third trimester: Secondary | ICD-10-CM

## 2022-06-04 DIAGNOSIS — R569 Unspecified convulsions: Secondary | ICD-10-CM

## 2022-06-04 DIAGNOSIS — O23593 Infection of other part of genital tract in pregnancy, third trimester: Secondary | ICD-10-CM | POA: Diagnosis present

## 2022-06-04 DIAGNOSIS — Z3A32 32 weeks gestation of pregnancy: Secondary | ICD-10-CM | POA: Diagnosis not present

## 2022-06-04 DIAGNOSIS — N760 Acute vaginitis: Secondary | ICD-10-CM

## 2022-06-04 DIAGNOSIS — Z3483 Encounter for supervision of other normal pregnancy, third trimester: Secondary | ICD-10-CM | POA: Diagnosis not present

## 2022-06-04 DIAGNOSIS — O99013 Anemia complicating pregnancy, third trimester: Secondary | ICD-10-CM

## 2022-06-04 NOTE — Progress Notes (Signed)
History:   Cynthia Villarreal is a 21 y.o. G2P1001 at [redacted]w[redacted]d by LMP being seen today for her first obstetrical visit. Late prenatal care due to not being in town , transportation issues. Her obstetrical history is significant for  previous pregnancy with IUGR, delivered vaginally at 37 weeks . Patient does intend to breast feed. Pregnancy history fully reviewed.  Patient reports vaginal irritation and wants evaluation for this.  Also reports history of seizures; had seizure-like activity during admission in 12/2021.  No EEG findings, negative Neurology evaluation and she did not follow up as recommended. Reports having episodes at least once per week, happens during episodes of anxiety. No loss of consciousness or trauma. Desires to follow up with Neurology.  No other concerns.      HISTORY: OB History  Gravida Para Term Preterm AB Living  2 1 1  0 0 1  SAB IAB Ectopic Multiple Live Births  0 0 0 0 1    # Outcome Date GA Lbr Len/2nd Weight Sex Delivery Anes PTL Lv  2 Current           1 Term 09/01/20 [redacted]w[redacted]d 19:27 / 00:09 5 lb 7.5 oz (2.481 kg) F Vag-Spont EPI  LIV     Name: Himmelberger,GIRL Dorethy     Apgar1: 9  Apgar5: 9     Past Medical History:  Diagnosis Date   Anxiety    Breast mass in female 12/18/2021   Chlamydia trachomatis infection in mother during pregnancy 04/04/2020   Positive 02/29/20, completed treatment Positive 03/24/20, completed treatment Repeat culture pending   Depression    Past Surgical History:  Procedure Laterality Date   NO PAST SURGERIES     Family History  Problem Relation Age of Onset   Hypertension Mother    Diabetes Mother    Heart disease Maternal Grandmother    Hypertension Other    Social History   Tobacco Use   Smoking status: Passive Smoke Exposure - Never Smoker   Smokeless tobacco: Never  Vaping Use   Vaping Use: Never used  Substance Use Topics   Alcohol use: No   Drug use: No   Allergies  Allergen Reactions   Caffeine Anxiety    Current Outpatient Medications on File Prior to Visit  Medication Sig Dispense Refill   Prenatal MV & Min w/FA-DHA (PRENATAL GUMMIES PO) Take 1 tablet by mouth in the morning and at bedtime.     acetaminophen (TYLENOL) 325 MG tablet Take 2 tablets (650 mg total) by mouth every 4 (four) hours as needed (for pain scale < 4). (Patient taking differently: Take 325 mg by mouth every 4 (four) hours as needed for moderate pain or headache (for pain scale < 4).)     Misc. Devices (GOJJI WEIGHT SCALE) MISC 1 Device by Does not apply route every 30 (thirty) days. 1 each 0   No current facility-administered medications on file prior to visit.    Review of Systems Pertinent items noted in HPI and remainder of comprehensive ROS otherwise negative.  Physical Exam:   Vitals:   06/04/22 1503  BP: 118/67  Pulse: (!) 110  Weight: 152 lb 4.8 oz (69.1 kg)   Fetal Heart Rate (bpm): 140  Uterine size: Fundal Height: 30 cm General: well-developed, well-nourished female in no acute distress  Breasts:  deferred   Skin: normal coloration and turgor, no rashes  Neurologic: oriented, normal, negative, normal mood  Extremities: normal strength, tone, and muscle mass, ROM of all joints is  normal  HEENT PERRLA, extraocular movement intact and sclera clear, anicteric  Neck supple and no masses  Cardiovascular: regular rate and rhythm  Respiratory:  no respiratory distress, normal breath sounds  Abdomen: soft, non-tender; bowel sounds normal; no masses,  no organomegaly  Pelvic: normal external genitalia with some diffuse erythema, scant white discharge seen and testing sample obtained, no discrete lesions. Exam done in the presence of a chaperone.     Assessment:    Pregnancy: G2P1001 Patient Active Problem List   Diagnosis Date Noted   Late prenatal care affecting pregnancy in third trimester (32 weeks) 06/04/2022   Seizure-like activity (HCC) 12/24/2021   Supervision of normal pregnancy 12/18/2021    Depression    History of prior pregnancy with IUGR newborn 07/03/2020    Plan:    1. Seizure-like activity (HCC) - Ambulatory referral to Neurology done. Seizure precautions reviewed with patient.  2. Vaginitis during pregnancy, third trimester - Cervicovaginal ancillary only( [place]) done, will follow up results and manage accordingly. Also recommended OTC hydrocortisone cream as needed.   3. Late prenatal care affecting pregnancy in third trimester 4. [redacted] weeks gestation of pregnancy 5. Encounter for supervision of other normal pregnancy in third trimester - CBC/D/Plt+RPR+Rh+ABO+RubIgG... - Culture, OB Urine - Comprehensive metabolic panel - Hemoglobin A1c - Glucose tolerance, 1 hour - [place] MFM OB DETAIL +14 WK; Future - Panorama Prenatal Test Full Panel  Initial labs drawn. Continue prenatal vitamins. Problem list reviewed and updated. Genetic Screening discussed, Panorama: ordered. Ultrasound discussed; fetal anatomic survey: scheduled. Anticipatory guidance about prenatal visits given including labs, ultrasounds, and testing. Patient was encouraged to use MyChart to review results, send requests, and have questions addressed.   The nature of [facility] with multiple MDs and Advanced Practice Providers was explained to patient; also emphasized that residents, students are part of our team. Routine obstetric precautions reviewed. Encouraged to seek out care at our office or emergency room [facility] preferred) for urgent and/or emergent concerns. Return in about 2 weeks (around 06/18/2022) for OFFICE OB VISIT (MD or APP).     Jaynie Collins, MD, FACOG Obstetrician & Gynecologist, [facility] for [employer], [facility]

## 2022-06-04 NOTE — Progress Notes (Signed)
Patient [redacted]w[redacted]d presents as new OB. Has not had established care during this pregnancy. Pt wants a referral to neurology due to seizures, but I explained to her that her PCP would need to see her and put in that referral.  PHQ and GAD negative.

## 2022-06-05 DIAGNOSIS — O99013 Anemia complicating pregnancy, third trimester: Secondary | ICD-10-CM | POA: Insufficient documentation

## 2022-06-05 LAB — CERVICOVAGINAL ANCILLARY ONLY
Bacterial Vaginitis (gardnerella): NEGATIVE
Candida Glabrata: NEGATIVE
Candida Vaginitis: POSITIVE — AB
Chlamydia: NEGATIVE
Comment: NEGATIVE
Comment: NEGATIVE
Comment: NEGATIVE
Comment: NEGATIVE
Comment: NEGATIVE
Comment: NORMAL
Neisseria Gonorrhea: NEGATIVE
Trichomonas: NEGATIVE

## 2022-06-05 LAB — CBC/D/PLT+RPR+RH+ABO+RUBIGG...
Antibody Screen: NEGATIVE
Basophils Absolute: 0 10*3/uL (ref 0.0–0.2)
Basos: 0 %
EOS (ABSOLUTE): 0 10*3/uL (ref 0.0–0.4)
Eos: 0 %
HCV Ab: NONREACTIVE
HIV Screen 4th Generation wRfx: NONREACTIVE
Hematocrit: 31.3 % — ABNORMAL LOW (ref 34.0–46.6)
Hemoglobin: 10.2 g/dL — ABNORMAL LOW (ref 11.1–15.9)
Hepatitis B Surface Ag: NEGATIVE
Immature Grans (Abs): 0.1 10*3/uL (ref 0.0–0.1)
Immature Granulocytes: 1 %
Lymphocytes Absolute: 1.9 10*3/uL (ref 0.7–3.1)
Lymphs: 21 %
MCH: 27.9 pg (ref 26.6–33.0)
MCHC: 32.6 g/dL (ref 31.5–35.7)
MCV: 86 fL (ref 79–97)
Monocytes Absolute: 0.7 10*3/uL (ref 0.1–0.9)
Monocytes: 7 %
Neutrophils Absolute: 6.3 10*3/uL (ref 1.4–7.0)
Neutrophils: 71 %
Platelets: 215 10*3/uL (ref 150–450)
RBC: 3.65 x10E6/uL — ABNORMAL LOW (ref 3.77–5.28)
RDW: 13.1 % (ref 11.7–15.4)
RPR Ser Ql: NONREACTIVE
Rh Factor: POSITIVE
Rubella Antibodies, IGG: 2.09 index (ref >0.99)
WBC: 9 10*3/uL (ref 3.4–10.8)

## 2022-06-05 LAB — HEMOGLOBIN A1C
Est. average glucose Bld gHb Est-mCnc: 105 mg/dL
Hgb A1c MFr Bld: 5.3 % (ref 4.8–5.6)

## 2022-06-05 LAB — COMPREHENSIVE METABOLIC PANEL
ALT: 6 IU/L (ref 0–32)
AST: 17 IU/L (ref 0–40)
Albumin/Globulin Ratio: 1.1 — ABNORMAL LOW (ref 1.2–2.2)
Albumin: 3.7 g/dL — ABNORMAL LOW (ref 4.0–5.0)
Alkaline Phosphatase: 111 IU/L — ABNORMAL HIGH (ref 42–106)
BUN/Creatinine Ratio: 9 (ref 9–23)
BUN: 5 mg/dL — ABNORMAL LOW (ref 6–20)
Bilirubin Total: 0.2 mg/dL (ref 0.0–1.2)
CO2: 20 mmol/L (ref 20–29)
Calcium: 8.9 mg/dL (ref 8.7–10.2)
Chloride: 101 mmol/L (ref 96–106)
Creatinine, Ser: 0.54 mg/dL — ABNORMAL LOW (ref 0.57–1.00)
Globulin, Total: 3.3 g/dL (ref 1.5–4.5)
Glucose: 98 mg/dL (ref 70–99)
Potassium: 3.2 mmol/L — ABNORMAL LOW (ref 3.5–5.2)
Sodium: 135 mmol/L (ref 134–144)
Total Protein: 7 g/dL (ref 6.0–8.5)
eGFR: 135 mL/min/{1.73_m2} (ref >59)

## 2022-06-05 LAB — GLUCOSE TOLERANCE, 1 HOUR: Glucose, 1Hr PP: 86 mg/dL (ref 70–199)

## 2022-06-05 LAB — HCV INTERPRETATION

## 2022-06-05 MED ORDER — TERCONAZOLE 0.4 % VA CREA: 1.0000 | TOPICAL_CREAM | Freq: Every day | VAGINAL | 0 refills | Status: AC

## 2022-06-05 MED ORDER — FERROUS GLUCONATE 324 (38 FE) MG PO TABS: 324.0000 mg | ORAL_TABLET | Freq: Every day | ORAL | 3 refills | Status: AC

## 2022-06-05 NOTE — Addendum Note (Signed)
Addended by: Jaynie Collins A on: 06/05/2022 02:11 PM   Modules accepted: Orders

## 2022-06-05 NOTE — Addendum Note (Signed)
Addended by: Jaynie Collins A on: 06/05/2022 08:15 AM   Modules accepted: Orders

## 2022-06-06 LAB — CULTURE, OB URINE

## 2022-06-06 LAB — URINE CULTURE, OB REFLEX

## 2022-06-10 ENCOUNTER — Encounter: Payer: Self-pay | Admitting: Obstetrics & Gynecology

## 2022-06-11 ENCOUNTER — Encounter: Payer: Self-pay | Admitting: Advanced Practice Midwife

## 2022-06-13 ENCOUNTER — Other Ambulatory Visit: Payer: Medicaid Other

## 2022-06-13 ENCOUNTER — Ambulatory Visit: Payer: Medicaid Other

## 2022-06-19 ENCOUNTER — Encounter: Payer: Medicaid Other | Admitting: Obstetrics and Gynecology

## 2022-06-23 ENCOUNTER — Ambulatory Visit: Payer: Medicaid Other

## 2022-06-23 ENCOUNTER — Ambulatory Visit: Payer: Medicaid Other | Attending: Obstetrics & Gynecology

## 2022-06-25 LAB — PANORAMA PRENATAL TEST FULL PANEL:PANORAMA TEST PLUS 5 ADDITIONAL MICRODELETIONS: FETAL FRACTION: 19.3

## 2022-07-07 ENCOUNTER — Encounter: Payer: Medicaid Other | Admitting: Advanced Practice Midwife

## 2022-07-10 ENCOUNTER — Ambulatory Visit: Payer: Medicaid Other | Admitting: Neurology

## 2022-08-04 ENCOUNTER — Ambulatory Visit: Payer: Medicaid Other | Admitting: Neurology

## 2022-08-04 ENCOUNTER — Encounter: Payer: Self-pay | Admitting: Neurology
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
## 2022-11-03 ENCOUNTER — Institutional Professional Consult (permissible substitution): Payer: Medicaid Other | Admitting: Plastic Surgery

## 2024-03-03 NOTE — Progress Notes (Signed)
 Subjective Patient ID: Cynthia Villarreal is a 23 y.o. female.  Is otherwise healthy 23 year old female who presents with sensation of foreign body to the right side of her neck area and into her chest at times.  She reports that this sensation comes and goes.  She reports she is able to swallow her own secretions and when it comes on it only lasts for 30 seconds.  She has not tried anything for her symptoms and states that it started last night at 11 PM.  She denies any large pieces of meat.  She denies coughing, history of GERD, vomiting.  She does report some mild nausea when it comes on.  She denies any history of anxiety or esophageal spasm.   History provided by:  Patient Language interpreter used: No     Review of Systems  Constitutional:  Negative for chills, fatigue and fever.  HENT:  Negative for congestion, drooling, mouth sores, sore throat, trouble swallowing and voice change.   Eyes:  Negative for visual disturbance.  Respiratory:  Negative for cough, choking and shortness of breath.   Cardiovascular:  Negative for chest pain.  Gastrointestinal:  Negative for abdominal pain, diarrhea, nausea and vomiting.  Musculoskeletal:  Negative for arthralgias and myalgias.  Skin:  Negative for rash and wound.  Neurological:  Negative for dizziness and headaches.    Patient History  Allergies: Allergies  Allergen Reactions  . Caffeine Anxiety     History reviewed. No pertinent past medical history. History reviewed. No pertinent surgical history. Social History   Socioeconomic History  . Marital status: Single    Spouse name: Not on file  . Number of children: Not on file  . Years of education: Not on file  . Highest education level: Not on file  Occupational History  . Not on file  Tobacco Use  . Smoking status: Never  . Smokeless tobacco: Never  Substance and Sexual Activity  . Alcohol use: Never  . Drug use: Not on file  . Sexual activity: Not on file  Other Topics  Concern  . Not on file  Social History Narrative  . Not on file   History reviewed. No pertinent family history. No current outpatient medications on file prior to visit.   No current facility-administered medications on file prior to visit.     Objective  Vitals:   03/03/24 1228  BP: 104/67  Pulse: 79  Resp: 16  Temp: 37 C (98.6 F)  TempSrc: Oral  SpO2: 100%  Weight: 65.8 kg  Height: 5' 4  PainSc:   7  LMP: 03/03/2024     LMP date is exact.  OBGYN/Pregnancy Status: Having periods       No results found.  Physical Exam Vitals and nursing note reviewed.  Constitutional:      General: She is not in acute distress.    Appearance: Normal appearance. She is not ill-appearing.  HENT:     Head: Normocephalic and atraumatic.     Right Ear: Tympanic membrane, ear canal and external ear normal.     Left Ear: Tympanic membrane, ear canal and external ear normal.     Nose: Nose normal.     Mouth/Throat:     Mouth: Mucous membranes are moist.     Comments: No visible FB in oropharynx Eyes:     Conjunctiva/sclera: Conjunctivae normal.  Cardiovascular:     Rate and Rhythm: Normal rate and regular rhythm.     Heart sounds: Normal heart sounds. No murmur  heard.    No friction rub.  Pulmonary:     Effort: Pulmonary effort is normal. No respiratory distress.     Breath sounds: Normal breath sounds. No stridor. No wheezing or rhonchi.  Abdominal:     General: Abdomen is flat.     Palpations: Abdomen is soft. There is no mass.     Tenderness: There is no abdominal tenderness.  Musculoskeletal:        General: No tenderness.  Lymphadenopathy:     Cervical: No cervical adenopathy.  Skin:    General: Skin is warm and dry.     Findings: No rash.  Neurological:     General: No focal deficit present.     Mental Status: She is alert.  Psychiatric:        Mood and Affect: Mood normal.        Behavior: Behavior normal.      Results for orders placed or performed in  visit on 03/03/24  XR chest 2 views   Narrative   EXAM: Chest, 2  View.  COMPARISON: None provided.    LUNGS: No acute airspace opacity or consolidation.  PLEURAL SPACES: No evidence of pleural effusion or pneumothorax.  MEDIASTINUM: Cardiac size and mediastinal contours within normal limits.  BONES: No acute osseous abnormality.  MISCELLANEOUS: No radiopaque foreign body.     Impression   1. No radiopaque foreign body.  2. No acute findings.  Electronically signed by: Dayton CROME. Rolene, M.D. on 03/03/2024 13:30:13     Procedures MDM:     1 Acute, uncomplicated illness or injury     Explanation of Medical Decision Making and variances from expected care:  Patient here with sensation of something stuck in her throat.  Chest x-ray shows no radiopaque foreign body.  I doubt food bolus as she is only had watermelon to eat.  I believe this to be globus sensation.   I have given her strict ED return precautions including nausea with vomiting, continued sensation, drooling, inability to swallow secretions.   Unique ordered tests: One     Review of any test results: One     Assessment requiring historian other than patient: No     Independent visualization of image, tracing, or test: No     Discussion of management with another provider: No     Risk:: Moderate          Assessment/Plan Diagnoses and all orders for this visit:  Globus pharyngeus -     XR chest 2 views -     aluminum-magnesium hydroxide-simethicone  (Maalox) 200-200-20 MG/5ML suspension 30 mL -     lidocaine  (Xylocaine ) 2 % mouth solution 5 mL      Disposition Status: Home  Progress note signed by Cathlean Gasman, PA on 03/03/24 at  1:53 PM

## 2024-06-15 ENCOUNTER — Telehealth: Payer: Self-pay | Admitting: Physician Assistant

## 2024-06-15 ENCOUNTER — Ambulatory Visit: Admitting: Physician Assistant

## 2024-06-15 NOTE — Telephone Encounter (Signed)
 Contacted pt to confirm she still wanted to be seen. Patient stated that she would like to cancel.

## 2024-08-17 ENCOUNTER — Emergency Department (HOSPITAL_COMMUNITY)
Admission: EM | Admit: 2024-08-17 | Discharge: 2024-08-18 | Discharge status: Against Medical Advice | Payer: Self-pay | Attending: Emergency Medicine | Admitting: Emergency Medicine

## 2024-08-17 ENCOUNTER — Other Ambulatory Visit: Payer: Self-pay

## 2024-08-17 DIAGNOSIS — R519 Headache, unspecified: Secondary | ICD-10-CM | POA: Insufficient documentation

## 2024-08-17 DIAGNOSIS — Z5321 Procedure and treatment not carried out due to patient leaving prior to being seen by health care provider: Secondary | ICD-10-CM | POA: Insufficient documentation

## 2024-08-17 MED ORDER — ACETAMINOPHEN 325 MG PO TABS
650.0000 mg | ORAL_TABLET | Freq: Once | ORAL | Status: AC
  Administered 2024-08-17: 650 mg via ORAL
  Filled 2024-08-17: qty 2

## 2024-08-17 NOTE — ED Triage Notes (Signed)
 Patient reports frontal headache for 2 weeks unrelieved by OTC pain medications . Denies head injury , no emesis or fever .

## 2024-08-18 ENCOUNTER — Emergency Department (HOSPITAL_COMMUNITY)
Admission: EM | Admit: 2024-08-18 | Discharge: 2024-08-18 | Disposition: A | Discharge status: Home / Self Care | Payer: MEDICAID | Attending: Emergency Medicine | Admitting: Emergency Medicine

## 2024-08-18 DIAGNOSIS — K029 Dental caries, unspecified: Secondary | ICD-10-CM | POA: Insufficient documentation

## 2024-08-18 DIAGNOSIS — K0889 Other specified disorders of teeth and supporting structures: Secondary | ICD-10-CM | POA: Diagnosis present

## 2024-08-18 LAB — POC URINE PREG, ED: Preg Test, Ur: NEGATIVE

## 2024-08-18 MED ORDER — BUPIVACAINE-EPINEPHRINE (PF) 0.5% -1:200000 IJ SOLN
1.8000 mL | Freq: Once | INTRAMUSCULAR | Status: AC
  Administered 2024-08-18: 1.8 mL
  Filled 2024-08-18: qty 1.8

## 2024-08-18 MED ORDER — KETOROLAC TROMETHAMINE 15 MG/ML IJ SOLN
15.0000 mg | Freq: Once | INTRAMUSCULAR | Status: AC
  Administered 2024-08-18: 15 mg via INTRAMUSCULAR
  Filled 2024-08-18: qty 1

## 2024-08-18 MED ORDER — AMOXICILLIN-POT CLAVULANATE 875-125 MG PO TABS: 1.0000 | ORAL_TABLET | Freq: Two times a day (BID) | ORAL | 0 refills | Status: AC

## 2024-08-18 NOTE — ED Triage Notes (Signed)
 Patient brought in by POV with c/o toothache that started 2 weeks ago that have gotten worse. Denies seeing a dentist.

## 2024-08-18 NOTE — Discharge Instructions (Addendum)
 You were seen in the ER today for evaluation of your dental pain. I am glad that you are feeling better! For pain, I recommend taking 1000mg  of Tylenol  and/or 600 mg of ibuprofen  every 6 hours as needed for pain.  You will need to follow-up with a dentist ultimately.  I have included information for dental resources into your discharge paperwork.  Please make sure you call to schedule an appointment.  I am prescribing you an antibiotic for you to take twice a day for the next week.  You can also try topical lidocaine  Orajel to the area as well.  I have included some more information for you to review.  If you have any concerns, new or worsening symptoms, please return to [facility] emergency department for reevaluation.  Contact a dental care provider if: You have dental pain and you don't know why. Your pain isn't controlled with medicines. Your symptoms get worse. You have new symptoms. Get help right away if: You can't open your mouth. You're having trouble breathing or swallowing. You have a fever. Your face, neck, or jaw is swollen. These symptoms may be an emergency. Call 911 right away. Do not wait to see if the symptoms will go away. Do not drive yourself to the hospital.

## 2024-08-18 NOTE — ED Provider Notes (Signed)
 [place] EMERGENCY DEPARTMENT AT [facility] Provider Note   CSN: [id number] Arrival date & time: 08/18/24  9665     Patient presents with: Dental Pain   Cynthia Villarreal is a 23 y.o. female self reportedly otherwise healthy presents emerged from today for evaluation of left-sided upper and lower dental pain for the past 2 weeks.  She reports that she was seen in the emergency department at [facility] earlier and was given Tylenol  however left prior to assessment.  Reports that she has tried 200 mg of ibuprofen  once during this time and did not have much relief.  Has not had any other interventions.  She has not followed up with a dentist due to insurance reasons.  She denies any fever.  Still able to eat and drink but has pain on that side.  Denies difficulty swallowing or breathing.  She reports that she has not seen the dentist in some time, years and years.  She denies any facial or neck swelling.  She reports that she feels the pain radiating up to the left side of her head.  No known drug allergies.   Dental Pain Associated symptoms: no facial swelling, no fever and no neck pain        Prior to Admission medications   Medication Sig Start Date End Date Taking? Authorizing Provider  acetaminophen  (TYLENOL ) 325 MG tablet Take 2 tablets (650 mg total) by mouth every 4 (four) hours as needed (for pain scale < 4). Patient taking differently: Take 325 mg by mouth every 4 (four) hours as needed for moderate pain or headache (for pain scale < 4). 09/03/20   Chip Aleck ORN, MD  ferrous gluconate  [facility]) 324 MG tablet Take 1 tablet (324 mg total) by mouth daily with breakfast. 06/05/22   Anyanwu, Gloris LABOR, MD  Misc. Devices (GOJJI WEIGHT SCALE) MISC 1 Device by Does not apply route every 30 (thirty) days. 12/18/21   Zina Jerilynn LABOR, MD  Prenatal MV & Min w/FA-DHA (PRENATAL GUMMIES PO) Take 1 tablet by mouth in the morning and at bedtime.    [provider]  terconazole   (TERAZOL 7 ) 0.4 % vaginal cream Place 1 applicator vaginally at bedtime. Use for seven days 06/05/22   Anyanwu, Gloris LABOR, MD    Allergies: Caffeine    Review of Systems  Constitutional:  Negative for chills and fever.  HENT:  Positive for dental problem. Negative for facial swelling and trouble swallowing.   Respiratory:  Negative for shortness of breath.   Musculoskeletal:  Negative for neck pain.    Updated Vital Signs BP (!) 146/95 (BP Location: Right Arm)   Pulse 92   Temp 98.8 F (37.1 C) (Oral)   Resp 18   Ht 5' 4 (1.626 m)   Wt 72.6 kg   SpO2 100%   BMI 27.46 kg/m   Physical Exam Vitals and nursing note reviewed.  Constitutional:      General: She is not in acute distress.    Appearance: She is not ill-appearing or toxic-appearing.  HENT:     Mouth/Throat:     Mouth: Mucous membranes are moist.     Comments: Dental caries present throughout.  Moist mucous membranes.  Uvula midline.  Airway patent.  Controlling secretions.  No sublingual elevation.  No trismus.  Speaking in full sentences with ease and normal phonation.  Does have some tenderness to the bottom and upper left jaw towards the back molars.  There is no induration or  fluctuance however patient does have some greenish tint dental carry present at the base of one of the lower left molars. Eyes:     General: No scleral icterus. Neck:     Comments: No swelling noted. Pulmonary:     Effort: Pulmonary effort is normal. No respiratory distress.  Skin:    General: Skin is warm and dry.  Neurological:     Mental Status: She is alert.     Comments: Patient reports sensation of the face is symmetric.  No droop.     (all labs ordered are listed, but only abnormal results are displayed) Labs Reviewed  POC URINE PREG, ED    EKG: None  Radiology: No results found.  Dental Block  Date/Time: 08/18/2024 6:32 AM  Performed by: Bernis Ernst, PA-C Authorized by: Bernis Ernst, PA-C   Consent:    Consent  obtained:  Verbal   Consent given by:  Patient   Risks, benefits, and alternatives were discussed: yes     Risks discussed:  Intravascular injection, infection, pain, nerve damage, unsuccessful block and swelling   Alternatives discussed:  No treatment Universal protocol:    Procedure explained and questions answered to patient or proxy's satisfaction: yes     Patient identity confirmed:  Verbally with patient Indications:    Indications: dental pain   Location:    Block type:  Inferior alveolar   Laterality:  Left Procedure details:    Topical anesthetic:  Lidocaine  gel   Syringe type:  Aspirating dental syringe   Needle gauge:  25 G   Anesthetic injected:  Bupivacaine  0.5% WITH epi   Injection procedure:  Anatomic landmarks identified, anatomic landmarks palpated, negative aspiration for blood, introduced needle and incremental injection Post-procedure details:    Outcome:  Pain relieved   Procedure completion:  Tolerated well, no immediate complications    Medications Ordered in the ED  bupivacaine -epinephrine  (PF) (MARCAINE  W/ EPI) 0.5% -1:200000 injection 1.8 mL (1.8 mLs Infiltration Incomplete 08/18/24 0633)  ketorolac  (TORADOL ) 15 MG/ML injection 15 mg (has no administration in time range)   Medical Decision Making Risk Prescription drug management.   23 y.o. female presents to the ER today for evaluation of dental pain. Differential diagnosis includes but is not limited to dental pain, dental caries, abscess, ludwig's angina. Vital signs elevated BP otherwise unremarkable. Physical exam as noted above.   Patient does not have any concerning symptoms for Ludwick's or deep space infection.  No trismus.  Controlling secretions.  No sublingual elevation.  Afebrile.  No facial swelling.  Patient reports sensation is symmetric in the bilateral aspects of the face.  No facial droop.  TMs are clear.  No neck swelling or lymphadenopathy appreciated.  Discussed potential dental block  with the patient however this will not give her any relief of her upper dentition it will give her some relief of the lower dentition.  Patient would like to proceed with this.  Risks and benefits discussed with this and she would still like to proceed.  Please see procedure note.  Anesthesia achieved.  Patient reports that she can no longer feel her bottom tooth and is feeling significant improvement of her pain.  Will send her home with some Augmentin  for dental infection however there is no drainable abscess appreciated.  She was given dental resources as well.  She understands that she needs a follow-up with a dentist for further and ultimate care of her tooth.  We discussed pain management here.  Pregnancy test was obtained  it was negative.  Will give her a shot of Toradol .  Information on pain discussed as well.  Stable for discharge home.  We discussed plan at bedside. We discussed strict return precautions and red flag symptoms. The patient verbalized their understanding and agrees to the plan. The patient is stable and being discharged home in good condition.  Portions of this report may have been transcribed using voice recognition software. Every effort was made to ensure accuracy; however, inadvertent computerized transcription errors may be present.    Final diagnoses:  Dental caries    ED Discharge Orders          Ordered    amoxicillin -clavulanate (AUGMENTIN ) 875-125 MG tablet  Every 12 hours        08/18/24 0658               Bernis Ernst, PA-C 08/18/24 0701    Raford Lenis, MD 08/18/24 [phone]

## 2024-08-27 ENCOUNTER — Emergency Department (HOSPITAL_COMMUNITY)
Admission: EM | Admit: 2024-08-27 | Discharge: 2024-08-27 | Disposition: A | Discharge status: Home / Self Care | Payer: MEDICAID

## 2024-08-27 ENCOUNTER — Other Ambulatory Visit: Payer: Self-pay

## 2024-08-27 ENCOUNTER — Encounter (HOSPITAL_COMMUNITY): Payer: Self-pay | Admitting: *Deleted

## 2024-08-27 DIAGNOSIS — K0889 Other specified disorders of teeth and supporting structures: Secondary | ICD-10-CM | POA: Diagnosis present

## 2024-08-27 DIAGNOSIS — K047 Periapical abscess without sinus: Secondary | ICD-10-CM | POA: Diagnosis not present

## 2024-08-27 DIAGNOSIS — Z0120 Encounter for dental examination and cleaning without abnormal findings: Secondary | ICD-10-CM

## 2024-08-27 LAB — POC URINE PREG, ED: Preg Test, Ur: NEGATIVE

## 2024-08-27 MED ORDER — ACETAMINOPHEN 325 MG PO TABS
650.0000 mg | ORAL_TABLET | Freq: Once | ORAL | Status: AC
  Administered 2024-08-27: 650 mg via ORAL

## 2024-08-27 MED ORDER — CARBAMIDE PEROXIDE 6.5 % OT SOLN: 5.0000 [drp] | Freq: Two times a day (BID) | OTIC | 0 refills | Status: AC

## 2024-08-27 MED ORDER — ACETAMINOPHEN 325 MG PO TABS
ORAL_TABLET | ORAL | Status: AC
  Filled 2024-08-27: qty 2

## 2024-08-27 MED ORDER — CEFDINIR 300 MG PO CAPS: 300.0000 mg | ORAL_CAPSULE | Freq: Two times a day (BID) | ORAL | 0 refills | Status: AC

## 2024-08-27 MED ORDER — KETOROLAC TROMETHAMINE 15 MG/ML IJ SOLN
15.0000 mg | Freq: Once | INTRAMUSCULAR | Status: AC
  Administered 2024-08-27: 15 mg via INTRAMUSCULAR
  Filled 2024-08-27: qty 1

## 2024-08-27 MED ORDER — MELOXICAM 15 MG PO TABS
15.0000 mg | ORAL_TABLET | Freq: Every day | ORAL | 0 refills | Status: AC
Start: 2024-08-27 — End: ?

## 2024-08-27 NOTE — ED Triage Notes (Signed)
 The pt has  multiple complaints toothache swollen lt face headache  lt ear pain  for 2 weeks she has seen a dentist that gave her  amoxicillin  she also has a lt soreness in her lt breast  lmp November 19

## 2024-08-27 NOTE — ED Provider Notes (Signed)
 [place] EMERGENCY DEPARTMENT AT [facility] Provider Note   CSN: [id number] Arrival date & time: 08/27/24  1710     Patient presents with: Dental Pain and Facial Pain   Cynthia Villarreal is a 23 y.o. female.  {Add pertinent medical, surgical, social history, OB history to YEP:67052}  Dental Pain Hx of dental caries 1 week ago, recently on amoxicillin  for otitis media and dental caries presenting with pain and swelling to the left side of her mouth for 2 weeks.  History per patient.  Endorses being on amoxicillin  for known dental abscess on the left posterior mandibular region, has been seen by dentistry, planning to have 6 tooth extractions.  She is concerned that she feels that there is still swelling present, and continued to have pain.  However, when asked where the pain is, she endorses that she is not really sure if she is having pain, but she still feels a swelling and thinks that this could be a form of pain.  She denies any fever or chills.  She has been on amoxicillin  for 9 days, and she endorses she has 6 tablets left.  Has been taking Motrin  at home without significant relief.  She endorses that there is swelling underneath her chin.     Prior to Admission medications   Medication Sig Start Date End Date Taking? Authorizing Provider  acetaminophen  (TYLENOL ) 325 MG tablet Take 2 tablets (650 mg total) by mouth every 4 (four) hours as needed (for pain scale < 4). Patient taking differently: Take 325 mg by mouth every 4 (four) hours as needed for moderate pain or headache (for pain scale < 4). 09/03/20   Chip Aleck ORN, MD  amoxicillin -clavulanate (AUGMENTIN ) 875-125 MG tablet Take 1 tablet by mouth every 12 (twelve) hours. 08/18/24   Bernis Ernst, PA-C  ferrous gluconate  (FERGON) 324 MG tablet Take 1 tablet (324 mg total) by mouth daily with breakfast. 06/05/22   Anyanwu, Gloris LABOR, MD  Misc. Devices (GOJJI WEIGHT SCALE) MISC 1 Device by Does not apply route every 30  (thirty) days. 12/18/21   Zina Jerilynn LABOR, MD  Prenatal MV & Min w/FA-DHA (PRENATAL GUMMIES PO) Take 1 tablet by mouth in the morning and at bedtime.    [provider]  terconazole  (TERAZOL 7 ) 0.4 % vaginal cream Place 1 applicator vaginally at bedtime. Use for seven days 06/05/22   Anyanwu, Gloris LABOR, MD    Allergies: Caffeine    Review of Systems  Updated Vital Signs BP 135/76 (BP Location: Right Arm)   Pulse 83   Temp 98.7 F (37.1 C)   Resp 17   Ht 5' 4 (1.626 m)   Wt 72.6 kg   LMP 08/24/2024   SpO2 100%   BMI 27.47 kg/m   Physical Exam Vitals and nursing note reviewed.  Constitutional:      General: She is not in acute distress.    Appearance: She is well-developed.  HENT:     Head: Normocephalic and atraumatic.     Comments: No appreciable facial swelling.  Patient is nontender to palpation over teeth, numerous dental caries appreciated, no appreciable gingival swelling, nontender throughout gingiva including in areas previously stated to have an abscess.  Maxilla and mandible are both nontender to palpation.  No swelling appreciable in the sublingual, submental, or submandibular region.  I am unable to appreciate any lymphadenopathy within the anterior and posterior cervical chains.    Mouth/Throat:     Mouth: Mucous membranes are moist.  Pharynx: Oropharynx is clear.  Eyes:     Conjunctiva/sclera: Conjunctivae normal.  Cardiovascular:     Rate and Rhythm: Normal rate and regular rhythm.     Heart sounds: No murmur heard. Pulmonary:     Effort: Pulmonary effort is normal. No respiratory distress.     Breath sounds: Normal breath sounds.  Abdominal:     Palpations: Abdomen is soft.     Tenderness: There is no abdominal tenderness.  Musculoskeletal:        General: No swelling.     Cervical back: Neck supple. No rigidity or tenderness.  Lymphadenopathy:     Cervical: No cervical adenopathy.  Skin:    General: Skin is warm and dry.     Capillary  Refill: Capillary refill takes less than 2 seconds.  Neurological:     Mental Status: She is alert.  Psychiatric:        Mood and Affect: Mood normal.     (all labs ordered are listed, but only abnormal results are displayed) Labs Reviewed - No data to display  EKG: None  Radiology: No results found.  {Document cardiac monitor, telemetry assessment procedure when appropriate:32947} Procedures   Medications Ordered in the ED  acetaminophen  (TYLENOL ) tablet 650 mg (650 mg Oral Given 08/27/24 1806)      {Click here for ABCD2, HEART and other calculators REFRESH Note before signing:1}                              Medical Decision Making Risk OTC drugs. Prescription drug management.   ***  {Document critical care time when appropriate  Document review of labs and clinical decision tools ie CHADS2VASC2, etc  Document your independent review of radiology images and any outside records  Document your discussion with family members, caretakers and with consultants  Document social determinants of health affecting pt's care  Document your decision making why or why not admission, treatments were needed:32947:::1}   Final diagnoses:  None    ED Discharge Orders     None

## 2024-08-27 NOTE — Discharge Instructions (Signed)
 I discussed the plan for discharge with the patient and/or their surrogate at bedside prior to discharge and they were in agreement with the plan and verbalized understanding of the return precautions provided. All questions answered to the best of my ability. Ultimately, the patient was discharged in stable condition with stable vital signs. I am reassured that they are capable of close follow up and good social support at home.   I recommend that you change antibiotics to cefdinir .

## 2024-08-27 NOTE — ED Triage Notes (Signed)
 Patient reports she has a cavity in the left side of her mouth  and now she is having pain in the left side of her face. She reports she's had the pain for the past 2 weeks.

## 2024-10-26 ENCOUNTER — Other Ambulatory Visit: Payer: Self-pay

## 2024-10-26 ENCOUNTER — Emergency Department (HOSPITAL_COMMUNITY)
Admission: EM | Admit: 2024-10-26 | Discharge: 2024-10-26 | Disposition: A | Discharge status: Home / Self Care | Payer: MEDICAID | Attending: Emergency Medicine | Admitting: Emergency Medicine

## 2024-10-26 DIAGNOSIS — K0381 Cracked tooth: Secondary | ICD-10-CM | POA: Insufficient documentation

## 2024-10-26 DIAGNOSIS — K0889 Other specified disorders of teeth and supporting structures: Secondary | ICD-10-CM | POA: Diagnosis present

## 2024-10-26 DIAGNOSIS — K047 Periapical abscess without sinus: Secondary | ICD-10-CM | POA: Insufficient documentation

## 2024-10-26 MED ORDER — AMOXICILLIN 500 MG PO CAPS: 1000.0000 mg | ORAL_CAPSULE | Freq: Two times a day (BID) | ORAL | 0 refills | Status: AC

## 2024-10-26 NOTE — Discharge Instructions (Addendum)
 You were seen in the emergency department for dental infection.  I have put you back on an antibiotic.  You can alternate Tylenol  and Motrin  as directed on the bottle for your pain.  You should follow-up with the [facility] of [facility] or talk with your primary care for other options.  [facility]  [facility]  [address]  [place], [place] [address]  Phone [phone]  The [employer] [facility] in [place] exemplifies the [employer] vision to improve the health and quality of life of all [place]ians by public house manager with a passion to care for the underserved and by leading the nation in community-based, service learning oral health education. We are committed to offering comprehensive general dental services for adults, children and special needs patients in a safe, caring and professional setting.  Appointments: Our clinic is open Monday through Friday 8:00 a.m. until 5:00 p.m. The amount of time scheduled for an appointment depends on the patients specific needs. We ask that you keep your appointed time for care or provide 24-hour notice of all appointment changes. Parents or legal guardians must accompany minor children.  Payment for Services: Medicaid and other insurance plans are welcome. Payment for services is due when services are rendered and may be made by cash or credit card. If you have dental insurance, we will assist you with your claim submission.   Emergencies: Emergency services will be provided Monday through Friday on a walk-in basis. Please arrive early for emergency services. After hours emergency services will be provided for patients of record as required.  Services:  Medical Illustrator Dentistry  Oral Surgery - Extractions  Root Canals  Sealants and Tooth Colored  Fillings  Crowns and Bridges  Dentures and Partial Dentures  Implant Services  Periodontal Services and Cleanings  Cosmetic Building Services Engineer  3-D/[employer]

## 2024-10-26 NOTE — ED Provider Notes (Signed)
" °  [place] [facility] AT [facility] Provider Note   CSN: [id number] Arrival date & time: 10/26/24  1414     Patient presents with: Dental Pain and Facial Swelling   Cynthia Villarreal is a 24 y.o. female.  {Add pertinent medical, surgical, social history, OB history to YEP:67052}  Dental Pain      Prior to Admission medications  Medication Sig Start Date End Date Taking? Authorizing Provider  acetaminophen  (TYLENOL ) 325 MG tablet Take 2 tablets (650 mg total) by mouth every 4 (four) hours as needed (for pain scale < 4). Patient taking differently: Take 325 mg by mouth every 4 (four) hours as needed for moderate pain or headache (for pain scale < 4). 09/03/20   Chip Aleck ORN, MD  amoxicillin -clavulanate (AUGMENTIN ) 875-125 MG tablet Take 1 tablet by mouth every 12 (twelve) hours. 08/18/24   Bernis Ernst, PA-C  carbamide peroxide (DEBROX) 6.5 % OTIC solution Place 5 drops into the left ear 2 (two) times daily. 08/27/24   Ula Prentice SAUNDERS, MD  ferrous gluconate  [facility]) 324 MG tablet Take 1 tablet (324 mg total) by mouth daily with breakfast. 06/05/22   Anyanwu, Gloris LABOR, MD  meloxicam  (MOBIC ) 15 MG tablet Take 1 tablet (15 mg total) by mouth daily. 08/27/24   Arlee Katz, MD  Misc. Devices (GOJJI WEIGHT SCALE) MISC 1 Device by Does not apply route every 30 (thirty) days. 12/18/21   Zina Jerilynn LABOR, MD  Prenatal MV & Min w/FA-DHA (PRENATAL GUMMIES PO) Take 1 tablet by mouth in the morning and at bedtime.    [provider]  terconazole  (TERAZOL 7 ) 0.4 % vaginal cream Place 1 applicator vaginally at bedtime. Use for seven days 06/05/22   Anyanwu, Gloris LABOR, MD    Allergies: Caffeine    Review of Systems  Updated Vital Signs BP (!) 143/86 (BP Location: Right Arm)   Pulse 82   Temp 97.8 F (36.6 C) (Oral)   Resp 19   LMP 10/18/2024   SpO2 100%   Physical Exam  (all labs ordered are listed, but only abnormal results are displayed) Labs  Reviewed - No data to display  EKG: None  Radiology: No results found.  {Document cardiac monitor, telemetry assessment procedure when appropriate:32947} Procedures   Medications Ordered in the ED - No data to display    {Click here for ABCD2, HEART and other calculators REFRESH Note before signing:1}                              Medical Decision Making  ***  {Document critical care time when appropriate  Document review of labs and clinical decision tools ie CHADS2VASC2, etc  Document your independent review of radiology images and any outside records  Document your discussion with family members, caretakers and with consultants  Document social determinants of health affecting pt's care  Document your decision making why or why not admission, treatments were needed:32947:::1}   Final diagnoses:  None    ED Discharge Orders     None        "

## 2024-10-26 NOTE — ED Triage Notes (Signed)
 Pt. Stated, I have an abscess tooth and it needs pulling but its taking 3000 and I dont have so they are giving me Amoxicillin . Im having facial swelling since last night.

## 2024-12-01 ENCOUNTER — Ambulatory Visit: Payer: MEDICAID | Admitting: Cardiology

## 2024-12-01 ENCOUNTER — Ambulatory Visit: Payer: Self-pay | Admitting: Obstetrics
